# Patient Record
Sex: Male | Born: 1975
Health system: Southern US, Community
[De-identification: ages and names within clinical notes are randomized; demographics above are authoritative.]

---

## 2011-08-31 ENCOUNTER — Emergency Department: Payer: Self-pay | Admitting: Internal Medicine

## 2015-01-31 DIAGNOSIS — F4322 Adjustment disorder with anxiety: Secondary | ICD-10-CM | POA: Insufficient documentation

## 2015-08-11 DIAGNOSIS — K429 Umbilical hernia without obstruction or gangrene: Secondary | ICD-10-CM | POA: Insufficient documentation

## 2015-10-28 DIAGNOSIS — F419 Anxiety disorder, unspecified: Secondary | ICD-10-CM | POA: Diagnosis not present

## 2015-10-28 DIAGNOSIS — K420 Umbilical hernia with obstruction, without gangrene: Secondary | ICD-10-CM | POA: Diagnosis not present

## 2015-10-28 DIAGNOSIS — Z6837 Body mass index (BMI) 37.0-37.9, adult: Secondary | ICD-10-CM | POA: Diagnosis not present

## 2015-10-28 DIAGNOSIS — K429 Umbilical hernia without obstruction or gangrene: Secondary | ICD-10-CM | POA: Diagnosis not present

## 2015-10-28 DIAGNOSIS — F329 Major depressive disorder, single episode, unspecified: Secondary | ICD-10-CM | POA: Diagnosis not present

## 2016-04-26 ENCOUNTER — Ambulatory Visit (INDEPENDENT_AMBULATORY_CARE_PROVIDER_SITE_OTHER): Payer: 59 | Admitting: Podiatry

## 2016-04-26 VITALS — BP 133/86 | HR 83 | Resp 18

## 2016-04-26 DIAGNOSIS — R21 Rash and other nonspecific skin eruption: Secondary | ICD-10-CM

## 2016-04-26 DIAGNOSIS — L011 Impetiginization of other dermatoses: Secondary | ICD-10-CM | POA: Diagnosis not present

## 2016-04-26 DIAGNOSIS — B353 Tinea pedis: Secondary | ICD-10-CM

## 2016-04-26 DIAGNOSIS — L905 Scar conditions and fibrosis of skin: Secondary | ICD-10-CM | POA: Diagnosis not present

## 2016-04-26 DIAGNOSIS — L97529 Non-pressure chronic ulcer of other part of left foot with unspecified severity: Secondary | ICD-10-CM | POA: Diagnosis not present

## 2016-04-26 MED ORDER — TERBINAFINE HCL 250 MG PO TABS: 250.0000 mg | ORAL_TABLET | Freq: Every day | ORAL | Status: DC

## 2016-04-26 MED ORDER — DESOXIMETASONE 0.25 % EX CREA: 1.0000 "application " | TOPICAL_CREAM | Freq: Two times a day (BID) | CUTANEOUS | Status: DC

## 2016-04-26 NOTE — Progress Notes (Signed)
   Subjective:    Patient ID: Edwin Vazquez, male    DOB: 12-May-1976, 40 y.o.   MRN: [id number]  HPI  40 year old male presents the office they for concerns of possible athlete's foot as well as a skin rash to both feet which is been ongoing for greater than 1 year. He states the rashes to both feet do itch quite a bit. He states that he did go a dermatologist and thought it could be eczema but was apparently not given any treatment. He states that on the bottom of his feet they get raw and tender and whelps will form and he pops them.     Review of Systems  All other systems reviewed and are negative.      Objective:   Physical Exam General: AAO x3, NAD  Dermatological: On the plantar aspect of the right foot and to the interspaces is dry, xerotic, erythematous skin with some evidence of dried bulla on the right plantar foot. On the dorsal aspect left foot or multiple small annular erythematous lesions which directly to edge. There is no drainage or pus. On the right foot it appears being comes in the dorsal aspect of the foot and trying to go up to the ankle. No tenderness solution this time.  Vascular: Dorsalis Pedis artery and Posterior Tibial artery pedal pulses are 2/4 bilateral with immedate capillary fill time. Pedal hair growth present.  There is no pain with calf compression, swelling, warmth, erythema.   Neruologic: Grossly intact via light touch bilateral. Vibratory intact via tuning fork bilateral. Protective threshold with Semmes Wienstein monofilament intact to all pedal sites bilateral.   Musculoskeletal: No gross boney pedal deformities bilateral. No pain, crepitus, or limitation noted with foot and ankle range of motion bilateral. Muscular strength 5/5 in all groups tested bilateral.  Gait: Unassisted, Nonantalgic.      Assessment & Plan:  40 year old male bilateral skin rash, eczema versus dermatitis versus other dermatological issue -Treatment options discussed  including all alternatives, risks, and complications -Etiology of symptoms were discussed -At this time I recommended a punch biopsy the lesion. On the left foot underneath one of the lesions half cc of lidocaine with epinephrine was infiltrated. A 3 mm punch biopsy was then obtained and this was sent to [facility] labs. Area was irrigated and a buttock ointment and a dressing applied. Recommend daily dressing changes with similar. Monitor for infection -Will go ahead and start Lamisil for 2 weeks orally. -Prescribed Topicort we do not apply to the biopsy site -Follow up in 2 weeks or sooner if any issues are to arise. In the meantime I encouraged to call any questions or concerns or any change in symptoms.  Celesta Gentile, DPM

## 2016-04-28 DIAGNOSIS — R000 Tachycardia, unspecified: Secondary | ICD-10-CM | POA: Diagnosis not present

## 2016-04-28 DIAGNOSIS — R5383 Other fatigue: Secondary | ICD-10-CM | POA: Diagnosis not present

## 2016-04-28 DIAGNOSIS — E669 Obesity, unspecified: Secondary | ICD-10-CM | POA: Diagnosis not present

## 2016-04-28 DIAGNOSIS — D485 Neoplasm of uncertain behavior of skin: Secondary | ICD-10-CM | POA: Diagnosis not present

## 2016-04-29 NOTE — Addendum Note (Signed)
Addended by: Cranford Mon R on: 04/29/2016 07:34 AM   Modules accepted: Orders

## 2016-05-10 ENCOUNTER — Telehealth: Payer: Self-pay | Admitting: *Deleted

## 2016-05-10 NOTE — Telephone Encounter (Signed)
Dr. Jacqualyn Posey reviewed 04/26/2016 biopsy as non-specific, continue the topicort, and lamisil for now, may need to biopsy another area if not better.  I informed pt of Dr.Wagoner's orders and pt states the area improved almost over night, it must be the oral medication because he hasn't been consistent with the cream.  I told pt to continue until seen in office 05/12/2016.

## 2016-05-12 ENCOUNTER — Ambulatory Visit (INDEPENDENT_AMBULATORY_CARE_PROVIDER_SITE_OTHER): Payer: 59 | Admitting: Podiatry

## 2016-05-12 ENCOUNTER — Encounter: Payer: Self-pay | Admitting: Podiatry

## 2016-05-12 DIAGNOSIS — B353 Tinea pedis: Secondary | ICD-10-CM

## 2016-05-12 DIAGNOSIS — R21 Rash and other nonspecific skin eruption: Secondary | ICD-10-CM | POA: Diagnosis not present

## 2016-05-12 MED ORDER — TERBINAFINE HCL 250 MG PO TABS: 250.0000 mg | ORAL_TABLET | Freq: Every day | ORAL | 0 refills | Status: DC

## 2016-05-16 NOTE — Progress Notes (Signed)
Subjective: 40 year old male presents the office if up with vibration bilateral skin rash. He states that the rash is actually much improved compared to what it was last appointment. He has not been using the Topicort knowing been using oral Lamisil. Denies any systemic complaints such as fevers, chills, nausea, vomiting. No acute changes since last appointment, and no other complaints at this time.   Objective: AAO x3, NAD DP/PT pulses palpable bilaterally, CRT less than 3 seconds Skin rash on bilateral feet. Improving. Continue the air in the dorsal aspect of the left foot is almost completely resolved. There is mild areas of erythematous, purulence, arch of the foot however there is no drainage. No open sores identified.  No edema, erythema, increase in warmth to bilateral lower extremities.  No open lesions or pre-ulcerative lesions.  No pain with calf compression, swelling, warmth, erythema  Assessment: Skin rash, improved  Plan: -All treatment options discussed with the patient including all alternatives, risks, complications.  -I discussed the biopsy results of the patient. This point I recommend continue with Lamisil for 2 more weeks. I did order blood work to have baseline studies as will be continuing Lamisil. Continues monitoring side effects. She currently denies any. -Follow-up next couple weeks if symptoms do not completely resolve or sooner if any issues are to arise. -Patient encouraged to call the office with any questions, concerns, change in symptoms.   Celesta Gentile, DPM

## 2016-06-02 DIAGNOSIS — R000 Tachycardia, unspecified: Secondary | ICD-10-CM | POA: Diagnosis not present

## 2016-06-02 DIAGNOSIS — E669 Obesity, unspecified: Secondary | ICD-10-CM | POA: Diagnosis not present

## 2016-06-09 ENCOUNTER — Ambulatory Visit: Payer: 59 | Admitting: Podiatry

## 2016-06-16 DIAGNOSIS — L812 Freckles: Secondary | ICD-10-CM | POA: Diagnosis not present

## 2016-06-16 DIAGNOSIS — Q829 Congenital malformation of skin, unspecified: Secondary | ICD-10-CM | POA: Diagnosis not present

## 2016-06-16 DIAGNOSIS — D225 Melanocytic nevi of trunk: Secondary | ICD-10-CM | POA: Diagnosis not present

## 2016-06-16 DIAGNOSIS — L578 Other skin changes due to chronic exposure to nonionizing radiation: Secondary | ICD-10-CM | POA: Diagnosis not present

## 2016-06-16 DIAGNOSIS — L738 Other specified follicular disorders: Secondary | ICD-10-CM | POA: Diagnosis not present

## 2016-06-16 DIAGNOSIS — Z1283 Encounter for screening for malignant neoplasm of skin: Secondary | ICD-10-CM | POA: Diagnosis not present

## 2016-06-16 DIAGNOSIS — D485 Neoplasm of uncertain behavior of skin: Secondary | ICD-10-CM | POA: Diagnosis not present

## 2016-06-16 DIAGNOSIS — D229 Melanocytic nevi, unspecified: Secondary | ICD-10-CM | POA: Diagnosis not present

## 2016-06-16 DIAGNOSIS — L859 Epidermal thickening, unspecified: Secondary | ICD-10-CM | POA: Diagnosis not present

## 2016-07-05 DIAGNOSIS — R062 Wheezing: Secondary | ICD-10-CM | POA: Diagnosis not present

## 2016-07-05 DIAGNOSIS — J209 Acute bronchitis, unspecified: Secondary | ICD-10-CM | POA: Diagnosis not present

## 2016-07-05 DIAGNOSIS — E669 Obesity, unspecified: Secondary | ICD-10-CM | POA: Diagnosis not present

## 2016-07-05 DIAGNOSIS — R05 Cough: Secondary | ICD-10-CM | POA: Diagnosis not present

## 2016-08-02 ENCOUNTER — Other Ambulatory Visit: Payer: Self-pay | Admitting: Nurse Practitioner

## 2016-08-02 ENCOUNTER — Ambulatory Visit
Admission: RE | Admit: 2016-08-02 | Discharge: 2016-08-02 | Disposition: A | Discharge status: Home / Self Care | Payer: 59 | Source: Ambulatory Visit | Attending: Nurse Practitioner | Admitting: Nurse Practitioner

## 2016-08-02 DIAGNOSIS — R05 Cough: Secondary | ICD-10-CM | POA: Insufficient documentation

## 2016-08-02 DIAGNOSIS — R062 Wheezing: Secondary | ICD-10-CM | POA: Insufficient documentation

## 2016-08-02 DIAGNOSIS — R059 Cough, unspecified: Secondary | ICD-10-CM

## 2016-12-26 DIAGNOSIS — E669 Obesity, unspecified: Secondary | ICD-10-CM | POA: Diagnosis not present

## 2016-12-26 DIAGNOSIS — H6501 Acute serous otitis media, right ear: Secondary | ICD-10-CM | POA: Diagnosis not present

## 2017-03-15 IMAGING — CR DG CHEST 2V
1 series · 2 of 2 positions shown · non-contrast
Comparison: None.

CLINICAL DATA: Cough and wheezing

EXAM:
CHEST  2 VIEW

[Series 1: w chest pa · 0.14mm/px · 2 of 2 slices shown]
[im 1/2]
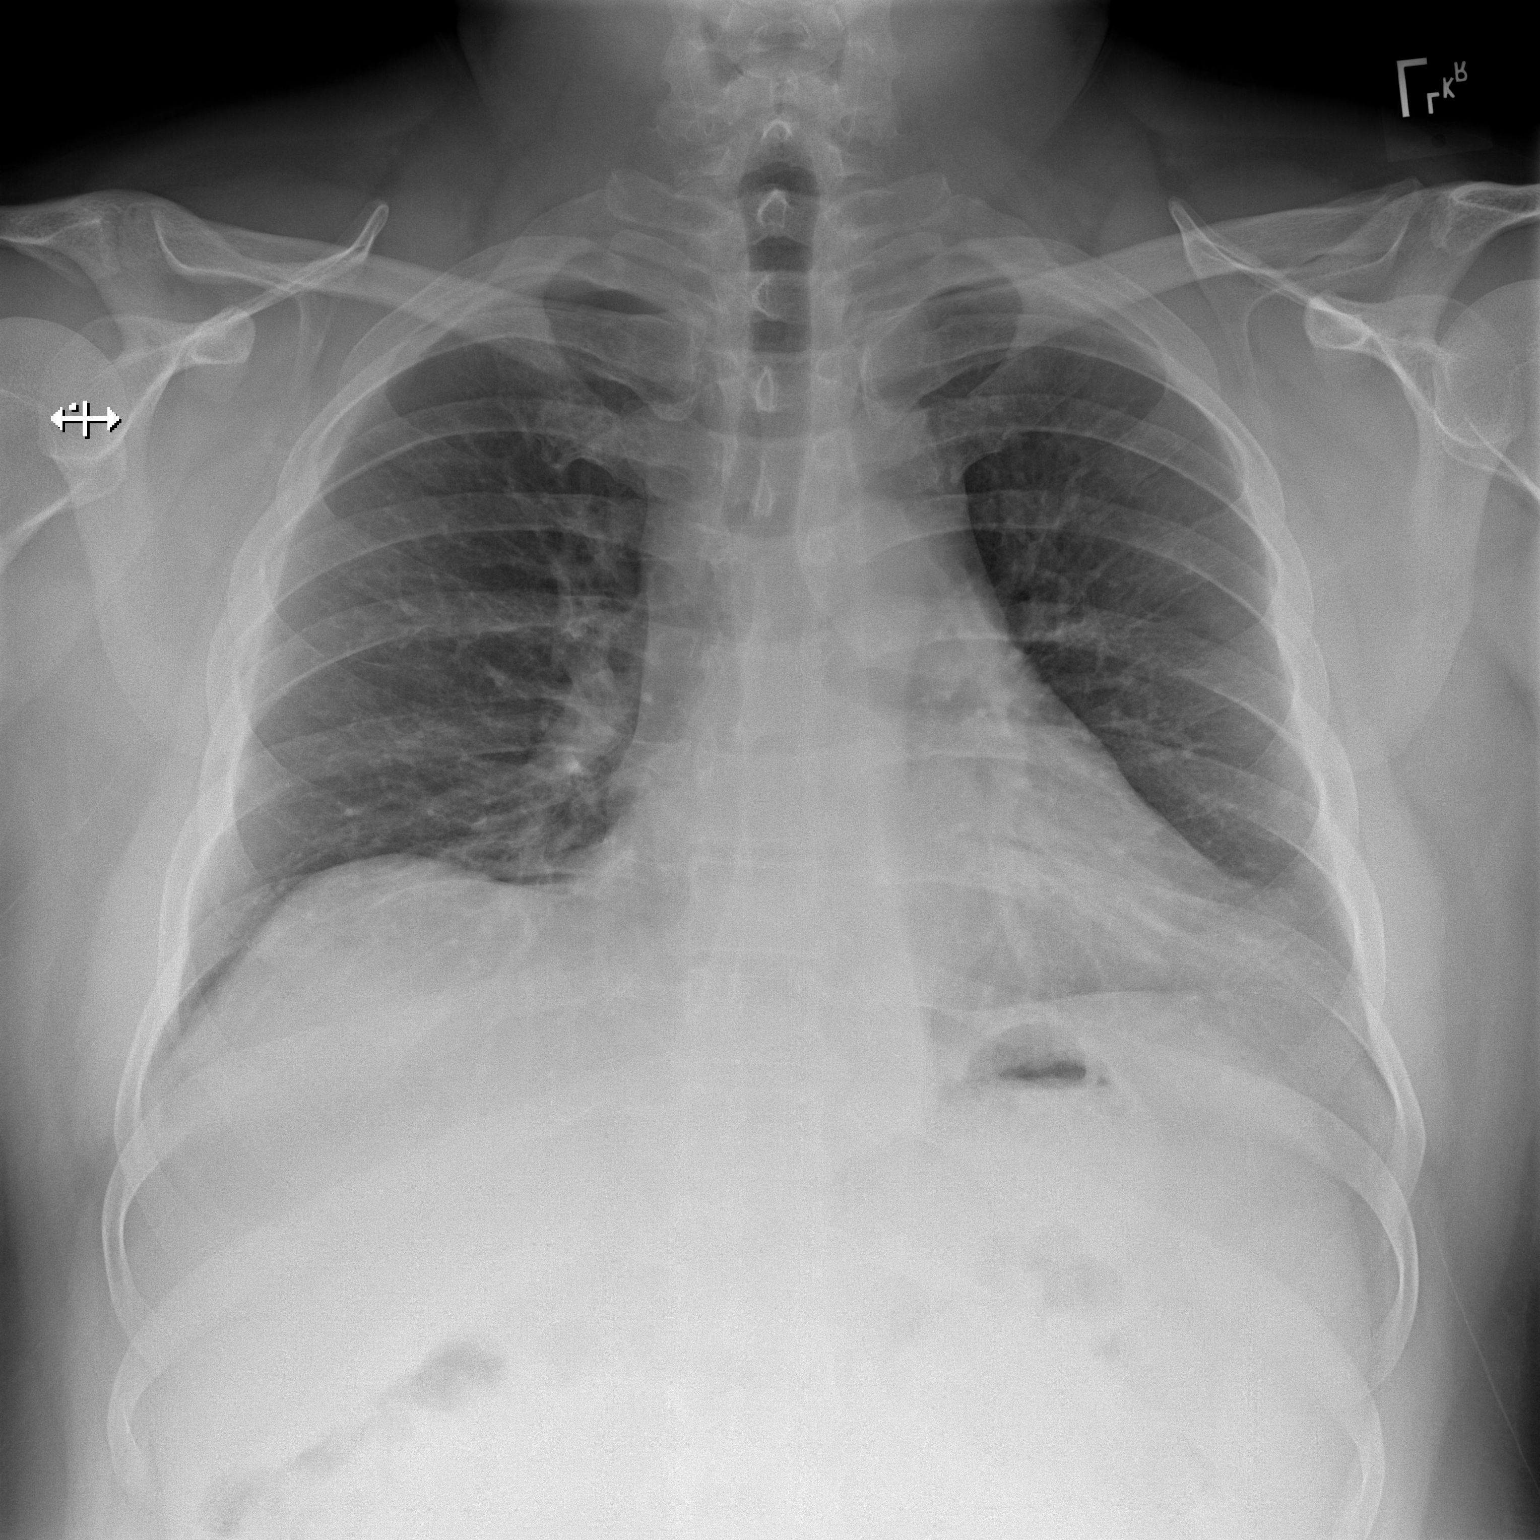
[im 2/2]
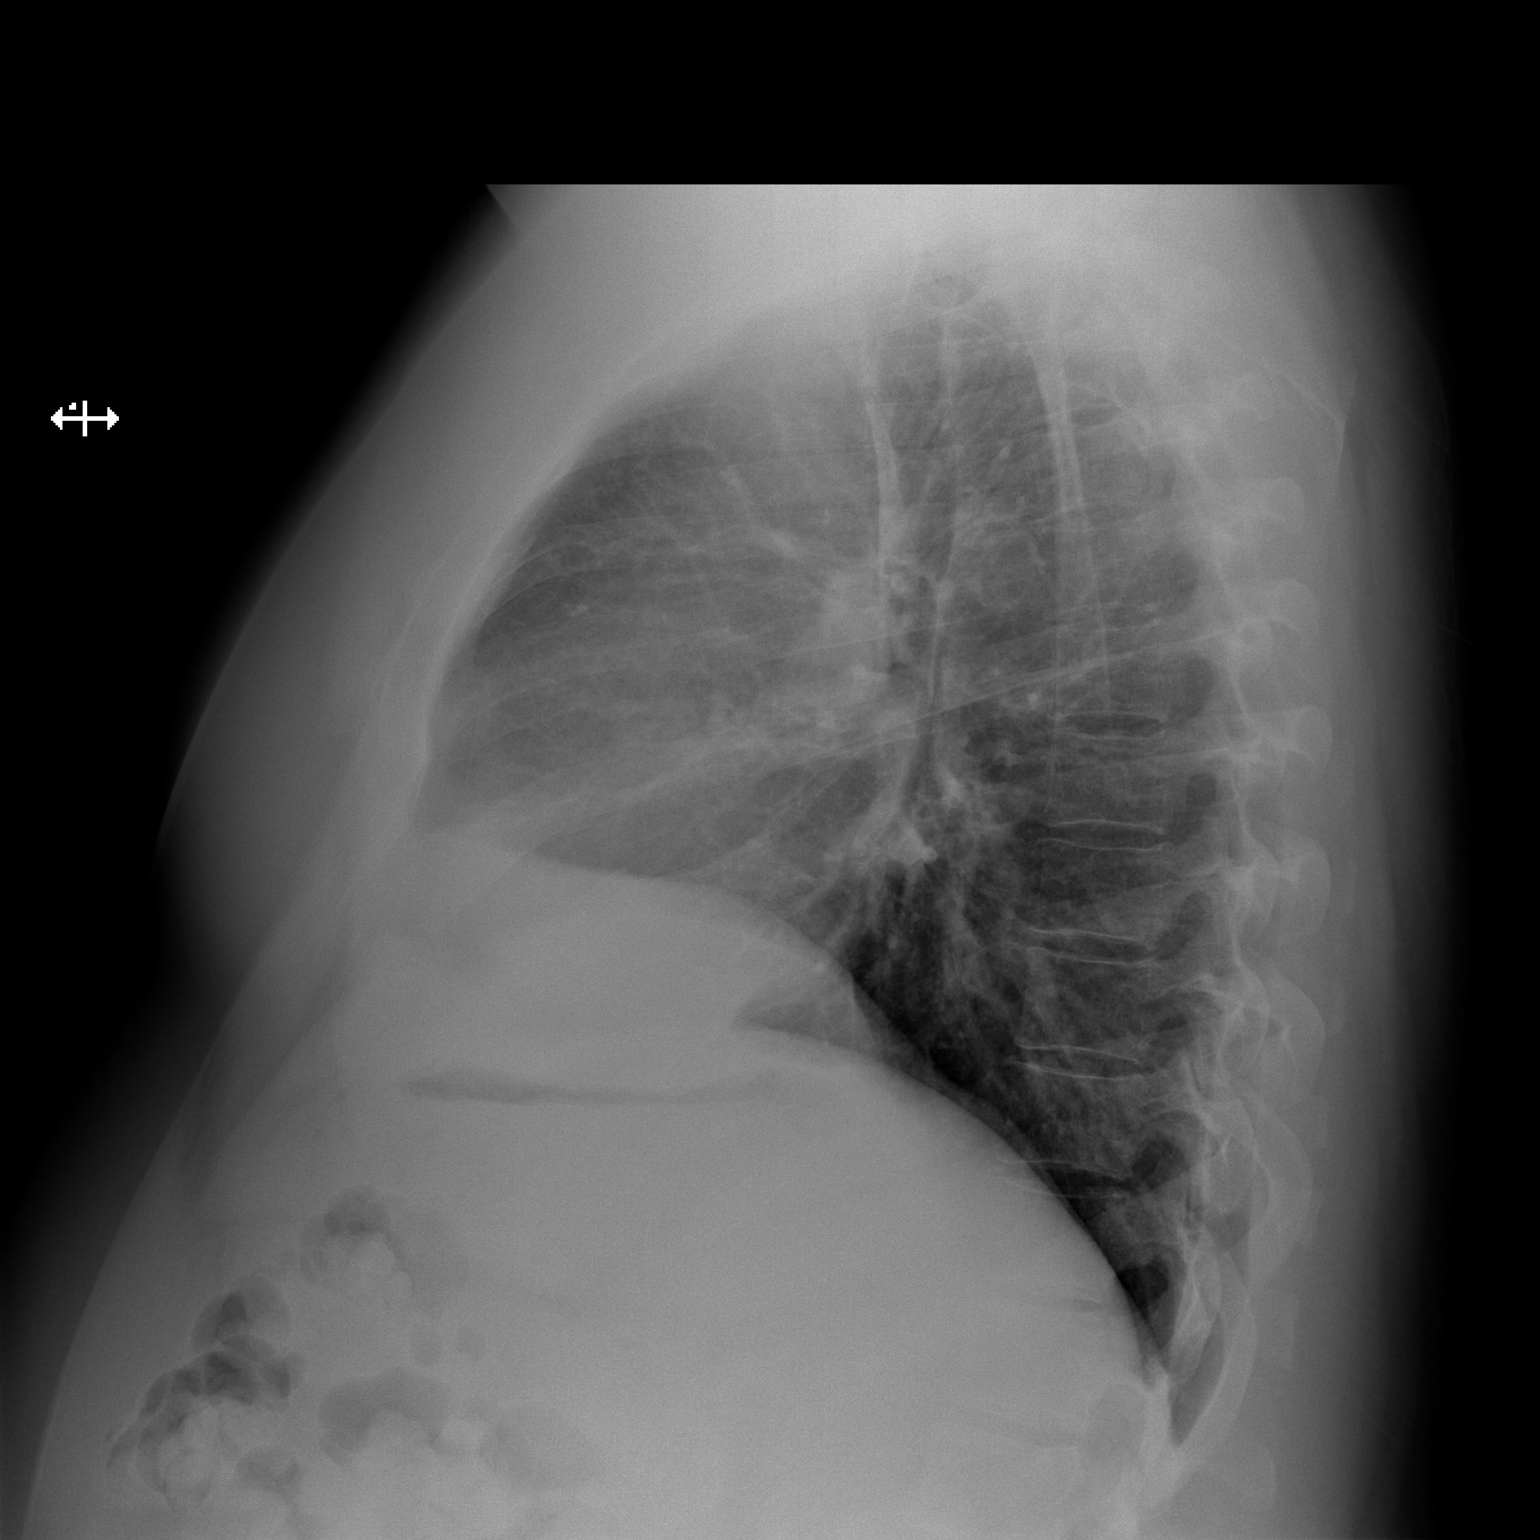

[2 of 2 positions shown; findings below may reference images not displayed]

FINDINGS: Normal heart size. Normal mediastinal contour. No pneumothorax. No
pleural effusion. Lungs appear clear, with no acute consolidative
airspace disease and no pulmonary edema.
IMPRESSION: No active cardiopulmonary disease.

## 2018-09-03 ENCOUNTER — Emergency Department: Payer: No Typology Code available for payment source

## 2018-09-03 ENCOUNTER — Emergency Department
Admission: EM | Admit: 2018-09-03 | Discharge: 2018-09-03 | Disposition: A | Discharge status: Home / Self Care | Payer: No Typology Code available for payment source | Attending: Emergency Medicine | Admitting: Emergency Medicine

## 2018-09-03 ENCOUNTER — Encounter: Payer: Self-pay | Admitting: *Deleted

## 2018-09-03 ENCOUNTER — Other Ambulatory Visit: Payer: Self-pay

## 2018-09-03 DIAGNOSIS — Y9389 Activity, other specified: Secondary | ICD-10-CM | POA: Diagnosis not present

## 2018-09-03 DIAGNOSIS — Y999 Unspecified external cause status: Secondary | ICD-10-CM | POA: Diagnosis not present

## 2018-09-03 DIAGNOSIS — S3991XA Unspecified injury of abdomen, initial encounter: Secondary | ICD-10-CM | POA: Insufficient documentation

## 2018-09-03 DIAGNOSIS — S199XXA Unspecified injury of neck, initial encounter: Secondary | ICD-10-CM | POA: Diagnosis present

## 2018-09-03 DIAGNOSIS — M542 Cervicalgia: Secondary | ICD-10-CM | POA: Insufficient documentation

## 2018-09-03 DIAGNOSIS — R109 Unspecified abdominal pain: Secondary | ICD-10-CM | POA: Insufficient documentation

## 2018-09-03 DIAGNOSIS — Y92410 Unspecified street and highway as the place of occurrence of the external cause: Secondary | ICD-10-CM | POA: Diagnosis not present

## 2018-09-03 LAB — COMPREHENSIVE METABOLIC PANEL
ALK PHOS: 68 U/L (ref 38–126)
ALT: 131 U/L — AB (ref 0–44)
ANION GAP: 6 (ref 5–15)
AST: 85 U/L — ABNORMAL HIGH (ref 15–41)
Albumin: 4.2 g/dL (ref 3.5–5.0)
BILIRUBIN TOTAL: 0.6 mg/dL (ref 0.3–1.2)
BUN: 13 mg/dL (ref 6–20)
CO2: 33 mmol/L — ABNORMAL HIGH (ref 22–32)
CREATININE: 1.02 mg/dL (ref 0.61–1.24)
Calcium: 8.6 mg/dL — ABNORMAL LOW (ref 8.9–10.3)
Chloride: 113 mmol/L — ABNORMAL HIGH (ref 98–111)
GFR calc Af Amer: >60 mL/min (ref >60)
GFR calc non Af Amer: >60 mL/min (ref >60)
GLUCOSE: 75 mg/dL (ref 70–99)
Potassium: 4.5 mmol/L (ref 3.5–5.1)
Sodium: 152 mmol/L — ABNORMAL HIGH (ref 135–145)
Total Protein: 7.4 g/dL (ref 6.5–8.1)

## 2018-09-03 LAB — CBC
HEMATOCRIT: 48.1 % (ref 39.0–52.0)
Hemoglobin: 16 g/dL (ref 13.0–17.0)
MCH: 28.9 pg (ref 26.0–34.0)
MCHC: 33.3 g/dL (ref 30.0–36.0)
MCV: 87 fL (ref 80.0–100.0)
NRBC: 0 % (ref 0.0–0.2)
Platelets: 218 10*3/uL (ref 150–400)
RBC: 5.53 MIL/uL (ref 4.22–5.81)
RDW: 12.6 % (ref 11.5–15.5)
WBC: 8.6 10*3/uL (ref 4.0–10.5)

## 2018-09-03 MED ORDER — SODIUM CHLORIDE 0.9 % IV BOLUS
1000.0000 mL | Freq: Once | INTRAVENOUS | Status: AC
  Administered 2018-09-03: 1000 mL via INTRAVENOUS

## 2018-09-03 MED ORDER — DIPHENHYDRAMINE HCL 50 MG/ML IJ SOLN
INTRAMUSCULAR | Status: AC
  Administered 2018-09-03: 50 mg via INTRAVENOUS
  Filled 2018-09-03: qty 1

## 2018-09-03 MED ORDER — IBUPROFEN 400 MG PO TABS
600.0000 mg | ORAL_TABLET | Freq: Once | ORAL | Status: AC
  Administered 2018-09-03: 600 mg via ORAL
  Filled 2018-09-03: qty 2

## 2018-09-03 MED ORDER — IOHEXOL 300 MG/ML  SOLN
125.0000 mL | Freq: Once | INTRAMUSCULAR | Status: AC | PRN
  Administered 2018-09-03: 125 mL via INTRAVENOUS
  Filled 2018-09-03: qty 125

## 2018-09-03 MED ORDER — DIPHENHYDRAMINE HCL 50 MG/ML IJ SOLN
50.0000 mg | Freq: Once | INTRAMUSCULAR | Status: AC
  Administered 2018-09-03: 50 mg via INTRAVENOUS

## 2018-09-03 MED ORDER — EPINEPHRINE 0.3 MG/0.3ML IJ SOAJ
0.3000 mg | Freq: Once | INTRAMUSCULAR | Status: DC
  Filled 2018-09-03: qty 0.3

## 2018-09-03 MED ORDER — PREDNISONE 20 MG PO TABS
60.0000 mg | ORAL_TABLET | Freq: Once | ORAL | Status: AC
  Administered 2018-09-03: 60 mg via ORAL
  Filled 2018-09-03: qty 3

## 2018-09-03 MED ORDER — PREDNISONE 10 MG (21) PO TBPK: ORAL_TABLET | ORAL | 0 refills | Status: DC

## 2018-09-03 NOTE — Discharge Instructions (Signed)
Please seek medical attention for any high fevers, chest pain, shortness of breath, change in behavior, persistent vomiting, bloody stool or any other new or concerning symptoms.  
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
## 2018-10-01 ENCOUNTER — Other Ambulatory Visit: Payer: Self-pay | Admitting: Orthopedic Surgery

## 2018-10-01 DIAGNOSIS — M5412 Radiculopathy, cervical region: Secondary | ICD-10-CM

## 2018-10-14 ENCOUNTER — Ambulatory Visit
Admission: RE | Admit: 2018-10-14 | Discharge: 2018-10-14 | Disposition: A | Discharge status: Home / Self Care | Payer: No Typology Code available for payment source | Source: Ambulatory Visit | Attending: Orthopedic Surgery | Admitting: Orthopedic Surgery

## 2018-10-14 DIAGNOSIS — M5412 Radiculopathy, cervical region: Secondary | ICD-10-CM | POA: Insufficient documentation

## 2018-11-13 ENCOUNTER — Ambulatory Visit: Payer: PRIVATE HEALTH INSURANCE | Attending: Neurosurgery | Admitting: Physical Therapy

## 2018-11-13 ENCOUNTER — Encounter: Payer: Self-pay | Admitting: Physical Therapy

## 2018-11-13 DIAGNOSIS — M542 Cervicalgia: Secondary | ICD-10-CM | POA: Insufficient documentation
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
## 2018-11-15 ENCOUNTER — Encounter: Payer: Self-pay | Admitting: Physical Therapy

## 2018-11-15 ENCOUNTER — Ambulatory Visit: Payer: PRIVATE HEALTH INSURANCE | Admitting: Physical Therapy

## 2018-11-15 DIAGNOSIS — M542 Cervicalgia: Secondary | ICD-10-CM
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
## 2018-11-20 ENCOUNTER — Encounter: Payer: Self-pay | Admitting: Physical Therapy

## 2018-11-20 ENCOUNTER — Ambulatory Visit: Payer: PRIVATE HEALTH INSURANCE | Admitting: Physical Therapy

## 2018-11-20 DIAGNOSIS — M542 Cervicalgia: Secondary | ICD-10-CM
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
## 2018-11-22 ENCOUNTER — Ambulatory Visit: Payer: PRIVATE HEALTH INSURANCE | Admitting: Physical Therapy

## 2018-11-26 ENCOUNTER — Ambulatory Visit: Payer: PRIVATE HEALTH INSURANCE | Admitting: Physical Therapy

## 2018-11-28 ENCOUNTER — Ambulatory Visit: Payer: PRIVATE HEALTH INSURANCE | Admitting: Physical Therapy

## 2018-12-03 ENCOUNTER — Ambulatory Visit: Payer: PRIVATE HEALTH INSURANCE | Admitting: Physical Therapy

## 2018-12-06 ENCOUNTER — Ambulatory Visit: Payer: PRIVATE HEALTH INSURANCE | Admitting: Physical Therapy

## 2018-12-11 ENCOUNTER — Encounter: Payer: PRIVATE HEALTH INSURANCE | Admitting: Physical Therapy

## 2018-12-14 ENCOUNTER — Encounter: Payer: PRIVATE HEALTH INSURANCE | Admitting: Physical Therapy

## 2018-12-17 ENCOUNTER — Encounter: Payer: PRIVATE HEALTH INSURANCE | Admitting: Physical Therapy

## 2018-12-18 ENCOUNTER — Encounter: Payer: PRIVATE HEALTH INSURANCE | Admitting: Physical Therapy

## 2018-12-19 ENCOUNTER — Encounter: Payer: PRIVATE HEALTH INSURANCE | Admitting: Physical Therapy

## 2018-12-20 ENCOUNTER — Encounter: Payer: PRIVATE HEALTH INSURANCE | Admitting: Physical Therapy

## 2018-12-24 ENCOUNTER — Encounter: Payer: PRIVATE HEALTH INSURANCE | Admitting: Physical Therapy

## 2018-12-25 ENCOUNTER — Encounter: Payer: PRIVATE HEALTH INSURANCE | Admitting: Physical Therapy

## 2018-12-27 ENCOUNTER — Encounter: Payer: PRIVATE HEALTH INSURANCE | Admitting: Physical Therapy

## 2018-12-31 ENCOUNTER — Encounter: Payer: PRIVATE HEALTH INSURANCE | Admitting: Physical Therapy

## 2019-01-01 ENCOUNTER — Encounter: Payer: PRIVATE HEALTH INSURANCE | Admitting: Physical Therapy

## 2019-01-03 ENCOUNTER — Encounter: Payer: PRIVATE HEALTH INSURANCE | Admitting: Physical Therapy

## 2019-01-07 ENCOUNTER — Encounter: Payer: PRIVATE HEALTH INSURANCE | Admitting: Physical Therapy

## 2019-01-08 ENCOUNTER — Encounter: Payer: PRIVATE HEALTH INSURANCE | Admitting: Physical Therapy

## 2019-02-20 ENCOUNTER — Other Ambulatory Visit: Payer: Self-pay | Admitting: Nurse Practitioner

## 2019-02-20 DIAGNOSIS — E668 Other obesity: Secondary | ICD-10-CM

## 2019-02-20 MED ORDER — PHENTERMINE HCL 37.5 MG PO CAPS: 37.5000 mg | ORAL_CAPSULE | ORAL | 2 refills | Status: DC

## 2019-02-20 NOTE — Progress Notes (Signed)
Renewed patients prescription for phentermine capsules  And sent to [facility].

## 2019-03-27 ENCOUNTER — Ambulatory Visit (INDEPENDENT_AMBULATORY_CARE_PROVIDER_SITE_OTHER): Payer: BC Managed Care – PPO | Admitting: Nurse Practitioner

## 2019-03-27 ENCOUNTER — Encounter: Payer: Self-pay | Admitting: Nurse Practitioner

## 2019-03-27 ENCOUNTER — Other Ambulatory Visit: Payer: Self-pay

## 2019-03-27 VITALS — BP 126/83 | HR 77 | Resp 16 | Ht 72.0 in | Wt 279.6 lb

## 2019-03-27 DIAGNOSIS — M5000 Cervical disc disorder with myelopathy, unspecified cervical region: Secondary | ICD-10-CM | POA: Diagnosis not present

## 2019-03-27 DIAGNOSIS — F4310 Post-traumatic stress disorder, unspecified: Secondary | ICD-10-CM

## 2019-03-27 DIAGNOSIS — Z89232 Acquired absence of left shoulder: Secondary | ICD-10-CM

## 2019-03-27 MED ORDER — DULOXETINE HCL 20 MG PO CPEP: 20.0000 mg | ORAL_CAPSULE | Freq: Every day | ORAL | 3 refills | Status: DC

## 2019-03-27 MED ORDER — PREDNISONE 10 MG (48) PO TBPK: ORAL_TABLET | ORAL | 0 refills | Status: DC
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
## 2019-03-30 DIAGNOSIS — F4310 Post-traumatic stress disorder, unspecified: Secondary | ICD-10-CM | POA: Insufficient documentation

## 2019-03-30 DIAGNOSIS — Z89232 Acquired absence of left shoulder: Secondary | ICD-10-CM | POA: Insufficient documentation

## 2019-03-30 DIAGNOSIS — M5000 Cervical disc disorder with myelopathy, unspecified cervical region: Secondary | ICD-10-CM | POA: Insufficient documentation

## 2019-04-17 DIAGNOSIS — M542 Cervicalgia: Secondary | ICD-10-CM | POA: Diagnosis not present

## 2019-05-13 ENCOUNTER — Other Ambulatory Visit: Payer: Self-pay

## 2019-05-13 DIAGNOSIS — Z20828 Contact with and (suspected) exposure to other viral communicable diseases: Secondary | ICD-10-CM

## 2019-05-13 DIAGNOSIS — Z20822 Contact with and (suspected) exposure to covid-19: Secondary | ICD-10-CM

## 2019-05-14 ENCOUNTER — Other Ambulatory Visit: Payer: Self-pay

## 2019-05-14 DIAGNOSIS — R6889 Other general symptoms and signs: Secondary | ICD-10-CM | POA: Diagnosis not present

## 2019-05-14 DIAGNOSIS — Z20822 Contact with and (suspected) exposure to covid-19: Secondary | ICD-10-CM

## 2019-05-15 ENCOUNTER — Other Ambulatory Visit: Payer: Self-pay

## 2019-05-15 ENCOUNTER — Ambulatory Visit (INDEPENDENT_AMBULATORY_CARE_PROVIDER_SITE_OTHER): Payer: BC Managed Care – PPO | Admitting: Licensed Clinical Social Worker

## 2019-05-15 ENCOUNTER — Encounter: Payer: Self-pay | Admitting: Licensed Clinical Social Worker

## 2019-05-15 DIAGNOSIS — F4310 Post-traumatic stress disorder, unspecified: Secondary | ICD-10-CM

## 2019-05-15 LAB — NOVEL CORONAVIRUS, NAA: SARS-CoV-2, NAA: NOT DETECTED
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
## 2019-05-29 DIAGNOSIS — M5412 Radiculopathy, cervical region: Secondary | ICD-10-CM | POA: Diagnosis not present

## 2019-05-29 DIAGNOSIS — M542 Cervicalgia: Secondary | ICD-10-CM | POA: Diagnosis not present

## 2019-06-05 ENCOUNTER — Encounter: Payer: Self-pay | Admitting: Licensed Clinical Social Worker

## 2019-06-05 ENCOUNTER — Ambulatory Visit (INDEPENDENT_AMBULATORY_CARE_PROVIDER_SITE_OTHER): Payer: BC Managed Care – PPO | Admitting: Licensed Clinical Social Worker

## 2019-06-05 ENCOUNTER — Other Ambulatory Visit: Payer: Self-pay

## 2019-06-05 DIAGNOSIS — F4310 Post-traumatic stress disorder, unspecified: Secondary | ICD-10-CM | POA: Diagnosis not present

## 2019-06-05 NOTE — Progress Notes (Signed)
Virtual Visit via Video Note  I connected with Edwin Vazquez on 06/05/19 at 10:00 AM EDT by a video enabled telemedicine application and verified that I am speaking with the correct person using two identifiers.   I discussed the limitations of evaluation and management by telemedicine and the availability of in person appointments. The patient expressed understanding and agreed to proceed.  I discussed the assessment and treatment plan with the patient. The patient was provided an opportunity to ask questions and all were answered. The patient agreed with the plan and demonstrated an understanding of the instructions.   The patient was advised to call back or seek an in-person evaluation if the symptoms worsen or if the condition fails to improve as anticipated.  I provided 55 minutes of non-face-to-face time during this encounter.   Alden Hipp, LCSW    THERAPIST PROGRESS NOTE  Session Time: 1000  Participation Level: Active  Behavioral Response: CasualAlertAnxious  Type of Therapy: Individual Therapy  Treatment Goals addressed: Anxiety  Interventions: CBT  Summary: Edwin Vazquez is a 43 y.o. male who presents with continued symptoms related to his diagnosis. Tan, who goes by Leroy Sea, reported doing well since our last session. He reports continued anxiety while driving, escalating to panic at times, which has impacted his life since our last session. LCSW asked Edwin Vazquez to recall his thoughts while driving, and asked him to articulate how that impacts his driving. He reports feeling others are not driving well, and are putting him at risk. He reports feeling angry, and often avoiding driving on the highways all together. LCSW reviewed the components of CBT and how they could be utilized to assist Edwin Vazquez with his anxiety and panic while driving. Edwin Vazquez expressed understanding and agreement with these ideas, and expressed feeling unsure if they would help. LCSW asked Edwin Vazquez  to at least give it a shot, while also attempting to ground himself with mindfulness techniques. LCSW provided Edwin Vazquez with several techniques he could utilize to do so, including muscle relaxation and breathing exercises. Edwin Vazquez expressed understanding and agreement. Edwin Vazquez also reported feeling pressure from his job to work on his day off. We reviewed how to utilize assertive communication to set an appropriate boundary with his employer. Edwin Vazquez expressed understanding with this idea as well.   Suicidal/Homicidal: No  Therapist Response: Edwin Vazquez continues to work towards his tx goals but has not yet reached them. We will continue to work on improving emotional regulation skills moving forward via CBT.  Plan: Return again in 4 weeks.  Diagnosis: Axis I: Post Traumatic Stress Disorder    Axis II: No diagnosis    Alden Hipp, LCSW 06/05/2019

## 2019-07-10 ENCOUNTER — Ambulatory Visit: Payer: BC Managed Care – PPO | Admitting: Licensed Clinical Social Worker

## 2019-07-10 ENCOUNTER — Other Ambulatory Visit: Payer: Self-pay

## 2019-07-16 ENCOUNTER — Other Ambulatory Visit: Payer: Self-pay

## 2019-07-16 DIAGNOSIS — Z20822 Contact with and (suspected) exposure to covid-19: Secondary | ICD-10-CM

## 2019-07-16 DIAGNOSIS — Z20828 Contact with and (suspected) exposure to other viral communicable diseases: Secondary | ICD-10-CM

## 2019-07-18 LAB — NOVEL CORONAVIRUS, NAA: SARS-CoV-2, NAA: NOT DETECTED

## 2019-07-23 DIAGNOSIS — M542 Cervicalgia: Secondary | ICD-10-CM | POA: Diagnosis not present

## 2019-08-20 ENCOUNTER — Ambulatory Visit (INDEPENDENT_AMBULATORY_CARE_PROVIDER_SITE_OTHER): Payer: BC Managed Care – PPO

## 2019-08-20 DIAGNOSIS — Z23 Encounter for immunization: Secondary | ICD-10-CM | POA: Diagnosis not present

## 2019-08-27 DIAGNOSIS — M62838 Other muscle spasm: Secondary | ICD-10-CM | POA: Diagnosis not present

## 2019-10-16 DIAGNOSIS — M542 Cervicalgia: Secondary | ICD-10-CM | POA: Diagnosis not present

## 2019-10-16 DIAGNOSIS — M5412 Radiculopathy, cervical region: Secondary | ICD-10-CM | POA: Diagnosis not present

## 2019-11-04 ENCOUNTER — Ambulatory Visit: Payer: BC Managed Care – PPO | Attending: Internal Medicine

## 2019-11-04 DIAGNOSIS — Z20822 Contact with and (suspected) exposure to covid-19: Secondary | ICD-10-CM

## 2019-11-05 ENCOUNTER — Other Ambulatory Visit: Payer: Self-pay

## 2019-11-05 LAB — NOVEL CORONAVIRUS, NAA: SARS-CoV-2, NAA: NOT DETECTED

## 2019-11-05 MED ORDER — AZITHROMYCIN 250 MG PO TABS: ORAL_TABLET | ORAL | 0 refills | Status: DC

## 2019-11-06 ENCOUNTER — Telehealth: Payer: Self-pay

## 2019-11-06 NOTE — Telephone Encounter (Signed)
Send zpak yesterday as per heather for sinus infection

## 2019-12-27 ENCOUNTER — Other Ambulatory Visit: Payer: Self-pay

## 2019-12-27 MED ORDER — DOXYCYCLINE HYCLATE 100 MG PO TABS: 100.0000 mg | ORAL_TABLET | Freq: Two times a day (BID) | ORAL | 0 refills | Status: DC

## 2020-02-07 ENCOUNTER — Other Ambulatory Visit: Payer: Self-pay

## 2020-02-07 MED ORDER — TAMSULOSIN HCL 0.4 MG PO CAPS: 0.4000 mg | ORAL_CAPSULE | Freq: Every day | ORAL | 0 refills | Status: DC

## 2020-02-07 MED ORDER — KETOROLAC TROMETHAMINE 10 MG PO TABS: 10.0000 mg | ORAL_TABLET | Freq: Four times a day (QID) | ORAL | 0 refills | Status: DC | PRN

## 2020-05-07 ENCOUNTER — Other Ambulatory Visit: Payer: Self-pay | Admitting: Nurse Practitioner

## 2020-05-07 DIAGNOSIS — M5000 Cervical disc disorder with myelopathy, unspecified cervical region: Secondary | ICD-10-CM

## 2020-05-07 MED ORDER — CYCLOBENZAPRINE HCL 10 MG PO TABS: ORAL_TABLET | ORAL | 2 refills | Status: DC

## 2020-05-11 DIAGNOSIS — M7542 Impingement syndrome of left shoulder: Secondary | ICD-10-CM | POA: Diagnosis not present

## 2020-05-18 DIAGNOSIS — M7542 Impingement syndrome of left shoulder: Secondary | ICD-10-CM | POA: Diagnosis not present

## 2020-05-25 DIAGNOSIS — M7542 Impingement syndrome of left shoulder: Secondary | ICD-10-CM | POA: Diagnosis not present

## 2020-06-17 DIAGNOSIS — M25512 Pain in left shoulder: Secondary | ICD-10-CM | POA: Diagnosis not present

## 2020-06-17 DIAGNOSIS — M7542 Impingement syndrome of left shoulder: Secondary | ICD-10-CM | POA: Diagnosis not present

## 2020-06-18 ENCOUNTER — Other Ambulatory Visit: Payer: Self-pay | Admitting: Nurse Practitioner

## 2020-06-18 DIAGNOSIS — J069 Acute upper respiratory infection, unspecified: Secondary | ICD-10-CM

## 2020-06-18 MED ORDER — IVERMECTIN 3 MG PO TABS: ORAL_TABLET | ORAL | 0 refills | Status: DC

## 2020-06-18 MED ORDER — AZITHROMYCIN 250 MG PO TABS: ORAL_TABLET | ORAL | 0 refills | Status: DC

## 2020-06-24 DIAGNOSIS — Z03818 Encounter for observation for suspected exposure to other biological agents ruled out: Secondary | ICD-10-CM | POA: Diagnosis not present

## 2020-06-24 DIAGNOSIS — Z1152 Encounter for screening for COVID-19: Secondary | ICD-10-CM | POA: Diagnosis not present

## 2020-07-22 DIAGNOSIS — M25512 Pain in left shoulder: Secondary | ICD-10-CM | POA: Diagnosis not present

## 2020-07-22 DIAGNOSIS — M7542 Impingement syndrome of left shoulder: Secondary | ICD-10-CM | POA: Diagnosis not present

## 2020-07-29 DIAGNOSIS — M25512 Pain in left shoulder: Secondary | ICD-10-CM | POA: Diagnosis not present

## 2020-07-29 DIAGNOSIS — M7542 Impingement syndrome of left shoulder: Secondary | ICD-10-CM | POA: Diagnosis not present

## 2020-08-25 ENCOUNTER — Other Ambulatory Visit: Payer: Self-pay | Admitting: Nurse Practitioner

## 2020-08-25 DIAGNOSIS — J329 Chronic sinusitis, unspecified: Secondary | ICD-10-CM

## 2020-08-25 MED ORDER — AZITHROMYCIN 250 MG PO TABS: ORAL_TABLET | ORAL | 0 refills | Status: DC

## 2020-09-16 ENCOUNTER — Ambulatory Visit
Admission: RE | Admit: 2020-09-16 | Discharge: 2020-09-16 | Disposition: A | Discharge status: Home / Self Care | Payer: BC Managed Care – PPO | Source: Ambulatory Visit | Attending: Nurse Practitioner | Admitting: Nurse Practitioner

## 2020-09-16 ENCOUNTER — Other Ambulatory Visit: Payer: Self-pay

## 2020-09-16 ENCOUNTER — Other Ambulatory Visit: Payer: Self-pay | Admitting: Nurse Practitioner

## 2020-09-16 ENCOUNTER — Ambulatory Visit
Admission: RE | Admit: 2020-09-16 | Discharge: 2020-09-16 | Disposition: A | Discharge status: Home / Self Care | Payer: BC Managed Care – PPO | Attending: Nurse Practitioner | Admitting: Nurse Practitioner

## 2020-09-16 DIAGNOSIS — R059 Cough, unspecified: Secondary | ICD-10-CM | POA: Diagnosis not present

## 2020-09-16 DIAGNOSIS — U071 COVID-19: Secondary | ICD-10-CM

## 2020-09-16 DIAGNOSIS — R0602 Shortness of breath: Secondary | ICD-10-CM | POA: Diagnosis not present

## 2020-09-16 DIAGNOSIS — J069 Acute upper respiratory infection, unspecified: Secondary | ICD-10-CM

## 2020-09-16 DIAGNOSIS — M5000 Cervical disc disorder with myelopathy, unspecified cervical region: Secondary | ICD-10-CM

## 2020-09-16 MED ORDER — PREDNISONE 10 MG (48) PO TBPK: ORAL_TABLET | ORAL | 0 refills | Status: DC

## 2020-09-17 NOTE — Progress Notes (Signed)
Chest x-ray normal

## 2020-12-30 ENCOUNTER — Other Ambulatory Visit (HOSPITAL_COMMUNITY): Payer: Self-pay

## 2021-02-10 ENCOUNTER — Other Ambulatory Visit: Payer: Self-pay

## 2021-02-10 DIAGNOSIS — J309 Allergic rhinitis, unspecified: Secondary | ICD-10-CM | POA: Diagnosis not present

## 2021-02-10 DIAGNOSIS — J329 Chronic sinusitis, unspecified: Secondary | ICD-10-CM | POA: Diagnosis not present

## 2021-02-10 MED ORDER — PREDNISONE 10 MG PO TABS
ORAL_TABLET | ORAL | 0 refills | Status: DC
  Filled 2021-02-10: qty 48, 12d supply, fill #0

## 2021-02-10 MED ORDER — CLINDAMYCIN HCL 300 MG PO CAPS
ORAL_CAPSULE | ORAL | 0 refills | Status: DC
  Filled 2021-02-10: qty 63, 21d supply, fill #0

## 2021-02-10 MED ORDER — FLUTICASONE PROPIONATE 50 MCG/ACT NA SUSP
NASAL | 10 refills | Status: AC
  Filled 2021-02-10: qty 16, 30d supply, fill #0

## 2021-03-03 DIAGNOSIS — J329 Chronic sinusitis, unspecified: Secondary | ICD-10-CM | POA: Diagnosis not present

## 2021-03-10 DIAGNOSIS — J301 Allergic rhinitis due to pollen: Secondary | ICD-10-CM | POA: Diagnosis not present

## 2021-03-17 ENCOUNTER — Other Ambulatory Visit: Payer: Self-pay

## 2021-03-17 DIAGNOSIS — J309 Allergic rhinitis, unspecified: Secondary | ICD-10-CM | POA: Diagnosis not present

## 2021-03-17 DIAGNOSIS — J342 Deviated nasal septum: Secondary | ICD-10-CM | POA: Diagnosis not present

## 2021-03-17 DIAGNOSIS — J329 Chronic sinusitis, unspecified: Secondary | ICD-10-CM | POA: Diagnosis not present

## 2021-03-17 MED ORDER — FLUTICASONE PROPIONATE 50 MCG/ACT NA SUSP
NASAL | 10 refills | Status: AC
  Filled 2021-03-17: qty 16, 30d supply, fill #0

## 2021-03-17 MED ORDER — CETIRIZINE HCL 10 MG PO TABS: 10.0000 mg | ORAL_TABLET | Freq: Every day | ORAL | 10 refills | Status: DC

## 2021-03-19 ENCOUNTER — Other Ambulatory Visit: Payer: Self-pay

## 2021-03-19 MED ORDER — EPINEPHRINE 0.3 MG/0.3ML IJ SOAJ
INTRAMUSCULAR | 1 refills | Status: DC
  Filled 2021-03-19: qty 2, 30d supply, fill #0

## 2021-03-25 ENCOUNTER — Encounter: Payer: Self-pay | Admitting: Nurse Practitioner

## 2021-03-25 ENCOUNTER — Ambulatory Visit (INDEPENDENT_AMBULATORY_CARE_PROVIDER_SITE_OTHER): Payer: 59 | Admitting: Nurse Practitioner

## 2021-03-25 ENCOUNTER — Other Ambulatory Visit: Payer: Self-pay

## 2021-03-25 VITALS — BP 128/84 | HR 90 | Temp 97.6°F | Ht 72.0 in | Wt 303.4 lb

## 2021-03-25 DIAGNOSIS — M25512 Pain in left shoulder: Secondary | ICD-10-CM

## 2021-03-25 DIAGNOSIS — Z7689 Persons encountering health services in other specified circumstances: Secondary | ICD-10-CM

## 2021-03-25 DIAGNOSIS — G8929 Other chronic pain: Secondary | ICD-10-CM

## 2021-03-25 NOTE — Progress Notes (Signed)
Edwin Vazquez Office Visit  Subjective:  Vazquez ID: Edwin Vazquez, male    DOB: April 25, 1976  Age: 45 y.o. MRN: [id number]  CC:  Chief Complaint  Vazquez presents with   New Vazquez (Initial Visit)    HPI Edwin Vazquez presents to establish new primary care provider. Today, he has concern of left shoulder pain. States that the shoulder started to bother him about 2 years ago. He initially went to [facility] for evaluation and treatment. States that he had two cortisone injections into the shoulder to relieve pain and inflammation and was then sent to physical therapy. There physical therapy was causing him to have increased pain levels. He stopped going. He states that he did have x-rays of the left shoulder, but an MRI or further imaging was never pursued. He states that lifting with the left arm or swimming makes the pain worse. The pain is sharp and feels like a tearing sensation. He states that he does continue to do ROM exercises. He would like a referral to see a new orthopedic provider. Specifically, he would like to see Dr. Marry Guan. The Vazquez has no further concerns or complaints. He denies chest pain, chest pressure, or shortness of breath. He denies headaches or visual disturbances. He denies abdominal pain, nausea, vomiting, or changes in bowel or bladder habits.   He is due to have a routine physical exam along with routine, fasting labs.   History reviewed. No pertinent past medical history.  History reviewed. No pertinent surgical history.  History reviewed. No pertinent family history.  Social History   Socioeconomic History   Marital status: Married    Spouse name: Not on file   Number of children: Not on file   Years of education: Not on file   Highest education level: Not on file  Occupational History   Not on file  Tobacco Use   Smoking status: Never   Smokeless tobacco: Never  Substance and Sexual Activity   Alcohol use: Never   Drug use:  Never   Sexual activity: Yes  Other Topics Concern   Not on file  Social History Narrative   Not on file   Social Determinants of Health   Financial Resource Strain: Not on file  Food Insecurity: Not on file  Transportation Needs: Not on file  Physical Activity: Not on file  Stress: Not on file  Social Connections: Not on file  Intimate Partner Violence: Not on file    ROS Review of Systems  Constitutional:  Negative for activity change, chills, fatigue and fever.  HENT:  Negative for postnasal drip, rhinorrhea, sinus pressure and sinus pain.   Eyes: Negative.   Respiratory:  Negative for cough, chest tightness and shortness of breath.   Cardiovascular:  Negative for chest pain and palpitations.  Gastrointestinal:  Negative for constipation, diarrhea, nausea and vomiting.  Endocrine: Negative for cold intolerance, heat intolerance, polydipsia, polyphagia and polyuria.  Musculoskeletal:  Positive for myalgias. Negative for back pain.       Left shoulder pain with decreased ROM and strength due to pain.   Skin:  Negative for rash.  Allergic/Immunologic: Positive for environmental allergies.  Neurological:  Negative for dizziness, weakness and headaches.  Psychiatric/Behavioral:  Negative for dysphoric mood. The Vazquez is not nervous/anxious.    Objective:   Today's Vitals   03/25/21 1414  BP: 128/84  Pulse: 90  Temp: 97.6 F (36.4 C)  SpO2: 96%  Weight: (!) 303 lb 6.4 oz (137.6  kg)  Height: 6' (1.829 m)   Body mass index is 41.15 kg/m.   Physical Exam Vitals and nursing note reviewed.  Constitutional:      Appearance: Normal appearance. He is well-developed. He is obese.  HENT:     Head: Normocephalic and atraumatic.     Mouth/Throat:     Mouth: Mucous membranes are moist.     Pharynx: Oropharynx is clear.  Eyes:     Pupils: Pupils are equal, round, and reactive to light.  Cardiovascular:     Rate and Rhythm: Normal rate and regular rhythm.     Pulses:  Normal pulses.     Heart sounds: Normal heart sounds.  Pulmonary:     Effort: Pulmonary effort is normal.     Breath sounds: Normal breath sounds.  Abdominal:     Palpations: Abdomen is soft.  Musculoskeletal:     Right shoulder: Normal.     Left shoulder: Tenderness present. Decreased range of motion. Decreased strength.       Arms:     Cervical back: Normal range of motion and neck supple.     Comments: There is tenderness with palpation over the [facility] joint and into the left bicep tendon. No bony abnormality or deformity is palpated. There is decreased ROM and strength of the left arm.   Lymphadenopathy:     Cervical: No cervical adenopathy.  Skin:    General: Skin is warm and dry.     Capillary Refill: Capillary refill takes less than 2 seconds.  Neurological:     General: No focal deficit present.     Mental Status: He is alert and oriented to person, place, and time.  Psychiatric:        Mood and Affect: Mood normal.        Behavior: Behavior normal.        Thought Content: Thought content normal.        Judgment: Judgment normal.    Assessment & Plan:  1. Encounter to establish care Appointment today to establish new primary care office. He will have routine, fasting labs and routine physical exam within the next two week.s   2. Chronic left shoulder pain Vazquez with pain in left shoulder for approximately two years. He has decreased ROM and strength of the left arm. Will refer to Dr., Marry Guan, orthopedics, for further evaluation and treatment.  - Ambulatory referral to Orthopedic Surgery    Problem List Items Addressed This Visit       Other   Encounter to establish care - Primary   Chronic left shoulder pain   Relevant Orders   Ambulatory referral to Orthopedic Surgery    Outpatient Encounter Medications as of 03/25/2021  Medication Sig   azithromycin (ZITHROMAX) 250 MG tablet Take one tablet daily by daily for 12 days. Take with food (Vazquez not taking:  Reported on 03/25/2021)   azithromycin (ZITHROMAX) 250 MG tablet TAKE 2 TABLETS BY MOUTH ON DAY 1, THEN TAKE 1 TABLET DAILY FOR THE NEXT 4 DAYS. MAY REPEAT IF NEEDED FOR CHRONIC SINUSITIS. (Vazquez not taking: Reported on 03/25/2021)   cetirizine (ZYRTEC) 10 MG tablet Take 1 tablet (10 mg total) by mouth daily. (Vazquez not taking: Reported on 03/25/2021)   clindamycin (CLEOCIN) 300 MG capsule Take one pill 3 times a day (Vazquez not taking: Reported on 03/25/2021)   cyclobenzaprine (FLEXERIL) 10 MG tablet Take 1/2 to 1 tablet po BID prn muscle pain/spasms (Vazquez not taking: Reported on 03/25/2021)   desoximetasone (  TOPICORT) 0.25 % cream Apply 1 application topically 2 (two) times daily. (Vazquez not taking: Reported on 03/25/2021)   doxycycline (VIBRA-TABS) 100 MG tablet Take 1 tablet (100 mg total) by mouth 2 (two) times daily. For 10 days. (Vazquez not taking: Reported on 03/25/2021)   DULoxetine (CYMBALTA) 20 MG capsule Take 1 capsule (20 mg total) by mouth daily. (Vazquez not taking: Reported on 03/25/2021)   EPINEPHrine 0.3 mg/0.3 mL IJ SOAJ injection Use as directed (Vazquez not taking: Reported on 03/25/2021)   fluticasone (FLONASE) 50 MCG/ACT nasal spray Use 2 two sprays in each nostril once daily (Vazquez not taking: Reported on 03/25/2021)   fluticasone (FLONASE) 50 MCG/ACT nasal spray Spray two sprays in each nostril once daily (Vazquez not taking: Reported on 03/25/2021)   ivermectin (STROMECTOL) 3 MG TABS tablet Take 8 tablets po daily for 5 days. (Vazquez not taking: Reported on 03/25/2021)   ketorolac (TORADOL) 10 MG tablet Take 1 tablet (10 mg total) by mouth every 6 (six) hours as needed. (Vazquez not taking: Reported on 03/25/2021)   phentermine 37.5 MG capsule Take 1 capsule (37.5 mg total) by mouth every morning. (Vazquez not taking: Reported on 03/25/2021)   predniSONE (DELTASONE) 10 MG tablet take as directed 12 day taper pack (Vazquez not taking: Reported on 03/25/2021)   predniSONE  (DELTASONE) 10 MG tablet TAKE 6 TABLETS BY MOUTH DAILY FOR 4 DAYS,THEN 4 TABS DAILY FOR 4 DAYS,THEN 2 TABS DAILY FOR 4 DAYS,THEN STOP (Vazquez not taking: Reported on 03/25/2021)   predniSONE (STERAPRED UNI-PAK 48 TAB) 10 MG (48) TBPK tablet 12 day taper - take by mouth as directed for 12 days (Vazquez not taking: Reported on 03/25/2021)   sodium fluoride (PREVIDENT 5000 PLUS) 1.1 % CREA dental cream USE 1 TO 2 TIMES DAILY AS DIRECTED (Vazquez not taking: Reported on 03/25/2021)   tamsulosin (FLOMAX) 0.4 MG CAPS capsule Take 1 capsule (0.4 mg total) by mouth daily. (Vazquez not taking: Reported on 03/25/2021)   terbinafine (LAMISIL) 250 MG tablet Take 1 tablet (250 mg total) by mouth daily. (Vazquez not taking: No sig reported)   terbinafine (LAMISIL) 250 MG tablet Take 1 tablet (250 mg total) by mouth daily. (Vazquez not taking: No sig reported)   No facility-administered encounter medications on file as of 03/25/2021.    Follow-up: Return in about 2 weeks (around 04/08/2021) for health maintenance exam with FBW attime of visit. Marland Kitchen   Ronnell Freshwater, NP

## 2021-03-30 ENCOUNTER — Other Ambulatory Visit: Payer: Self-pay

## 2021-04-05 DIAGNOSIS — Z7689 Persons encountering health services in other specified circumstances: Secondary | ICD-10-CM | POA: Insufficient documentation

## 2021-04-05 DIAGNOSIS — M25512 Pain in left shoulder: Secondary | ICD-10-CM | POA: Insufficient documentation

## 2021-04-07 ENCOUNTER — Ambulatory Visit (INDEPENDENT_AMBULATORY_CARE_PROVIDER_SITE_OTHER): Payer: 59 | Admitting: Nurse Practitioner

## 2021-04-07 ENCOUNTER — Other Ambulatory Visit: Payer: Self-pay

## 2021-04-07 ENCOUNTER — Encounter: Payer: Self-pay | Admitting: Nurse Practitioner

## 2021-04-07 VITALS — BP 121/83 | HR 76 | Temp 98.0°F | Ht 73.0 in | Wt 300.0 lb

## 2021-04-07 DIAGNOSIS — M25512 Pain in left shoulder: Secondary | ICD-10-CM

## 2021-04-07 DIAGNOSIS — Z23 Encounter for immunization: Secondary | ICD-10-CM | POA: Diagnosis not present

## 2021-04-07 DIAGNOSIS — G8929 Other chronic pain: Secondary | ICD-10-CM | POA: Diagnosis not present

## 2021-04-07 DIAGNOSIS — Z6839 Body mass index (BMI) 39.0-39.9, adult: Secondary | ICD-10-CM

## 2021-04-07 DIAGNOSIS — Z114 Encounter for screening for human immunodeficiency virus [HIV]: Secondary | ICD-10-CM | POA: Diagnosis not present

## 2021-04-07 DIAGNOSIS — Z0000 Encounter for general adult medical examination without abnormal findings: Secondary | ICD-10-CM | POA: Diagnosis not present

## 2021-04-07 DIAGNOSIS — Z1159 Encounter for screening for other viral diseases: Secondary | ICD-10-CM

## 2021-04-07 DIAGNOSIS — Z0001 Encounter for general adult medical examination with abnormal findings: Secondary | ICD-10-CM

## 2021-04-07 NOTE — Progress Notes (Signed)
Established Patient Office Visit  Subjective:  Patient ID: Edwin Vazquez, male    DOB: [birth year]  Age: 45 y.o. 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  He is reporting no physical concerns or complaints at this time.  He denies chest pain, chest pressure, or shortness of breath. He denies headaches or visual disturbances. He denies abdominal pain, nausea, vomiting, or changes in bowel or bladder habits.  He would like to get his tetanus vaccine while in the office today.  He does need to have routine, fasting lab work done.  He would like to add screening for HIV and hepatitis C while getting his routine lab work done.   No past medical history on file.  No past surgical history on file.  No family history on file.  Social History   Socioeconomic History   Marital status: Married    Spouse name: Not on file   Number of children: Not on file   Years of education: Not on file   Highest education level: Not on file  Occupational History   Not on file  Tobacco Use   Smoking status: Never   Smokeless tobacco: Never  Substance and Sexual Activity   Alcohol use: Never   Drug use: Never   Sexual activity: Yes  Other Topics Concern   Not on file  Social History Narrative   Not on file   Social Determinants of Health   Financial Resource Strain: Not on file  Food Insecurity: Not on file  Transportation Needs: Not on file  Physical Activity: Not on file  Stress: Not on file  Social Connections: Not on file  Intimate Partner Violence: Not on file    Outpatient Medications Prior to Visit  Medication Sig Dispense Refill   fluticasone (FLONASE) 50 MCG/ACT nasal spray Use 2 two sprays in each nostril once daily (Patient not taking: Reported on 03/25/2021) 16 g 10   fluticasone (FLONASE) 50 MCG/ACT nasal spray Spray two sprays in each nostril once daily (Patient not  taking: Reported on 03/25/2021) 16 g 10   azithromycin (ZITHROMAX) 250 MG tablet Take one tablet daily by daily for 12 days. Take with food (Patient not taking: Reported on 03/25/2021) 12 tablet 0   azithromycin (ZITHROMAX) 250 MG tablet TAKE 2 TABLETS BY MOUTH ON DAY 1, THEN TAKE 1 TABLET DAILY FOR THE NEXT 4 DAYS. MAY REPEAT IF NEEDED FOR CHRONIC SINUSITIS. (Patient not taking: Reported on 03/25/2021) 12 tablet 0   cetirizine (ZYRTEC) 10 MG tablet Take 1 tablet (10 mg total) by mouth daily. (Patient not taking: Reported on 03/25/2021) 90 tablet 10   clindamycin (CLEOCIN) 300 MG capsule Take one pill 3 times a day (Patient not taking: Reported on 03/25/2021) 63 capsule 0   cyclobenzaprine (FLEXERIL) 10 MG tablet Take 1/2 to 1 tablet po BID prn muscle pain/spasms (Patient not taking: Reported on 03/25/2021) 45 tablet 2   desoximetasone (TOPICORT) 0.25 % cream Apply 1 application topically 2 (two) times daily. (Patient not taking: Reported on 03/25/2021) 30 g 2   doxycycline (VIBRA-TABS) 100 MG tablet Take 1 tablet (100 mg total) by mouth 2 (two) times daily. For 10 days. (Patient not taking: Reported on 03/25/2021) 20 tablet 0   DULoxetine (CYMBALTA) 20 MG capsule Take 1 capsule (20 mg total) by mouth daily. (Patient not taking: Reported on 03/25/2021) 30 capsule 3  EPINEPHrine 0.3 mg/0.3 mL IJ SOAJ injection Use as directed (Patient not taking: Reported on 03/25/2021) 2 each 1   ivermectin (STROMECTOL) 3 MG TABS tablet Take 8 tablets po daily for 5 days. (Patient not taking: Reported on 03/25/2021) 40 tablet 0   ketorolac (TORADOL) 10 MG tablet Take 1 tablet (10 mg total) by mouth every 6 (six) hours as needed. (Patient not taking: Reported on 03/25/2021) 30 tablet 0   phentermine 37.5 MG capsule Take 1 capsule (37.5 mg total) by mouth every morning. (Patient not taking: Reported on 03/25/2021) 30 capsule 2   predniSONE (DELTASONE) 10 MG tablet take as directed 12 day taper pack (Patient not taking: Reported on  03/25/2021) 48 tablet 0   predniSONE (DELTASONE) 10 MG tablet TAKE 6 TABLETS BY MOUTH DAILY FOR 4 DAYS,THEN 4 TABS DAILY FOR 4 DAYS,THEN 2 TABS DAILY FOR 4 DAYS,THEN STOP (Patient not taking: Reported on 03/25/2021) 48 tablet 0   predniSONE (STERAPRED UNI-PAK 48 TAB) 10 MG (48) TBPK tablet 12 day taper - take by mouth as directed for 12 days (Patient not taking: Reported on 03/25/2021) 48 tablet 0   sodium fluoride (PREVIDENT 5000 PLUS) 1.1 % CREA dental cream USE 1 TO 2 TIMES DAILY AS DIRECTED (Patient not taking: Reported on 03/25/2021) 51 g 1   tamsulosin (FLOMAX) 0.4 MG CAPS capsule Take 1 capsule (0.4 mg total) by mouth daily. (Patient not taking: Reported on 03/25/2021) 30 capsule 0   terbinafine (LAMISIL) 250 MG tablet Take 1 tablet (250 mg total) by mouth daily. (Patient not taking: No sig reported) 14 tablet 0   terbinafine (LAMISIL) 250 MG tablet Take 1 tablet (250 mg total) by mouth daily. (Patient not taking: No sig reported) 14 tablet 0   No facility-administered medications prior to visit.    Allergies  Allergen Reactions   Latex Itching   Iodinated Diagnostic Agents Hives and Itching    "itching, burning" sensation    ROS Review of Systems  Constitutional:  Negative for activity change, appetite change, chills, fatigue and fever.  HENT:  Negative for congestion, postnasal drip, rhinorrhea, sinus pressure and sinus pain.   Eyes: Negative.   Respiratory:  Negative for cough, chest tightness, shortness of breath and wheezing.   Cardiovascular:  Negative for chest pain and palpitations.  Gastrointestinal:  Negative for constipation, diarrhea, nausea and vomiting.  Endocrine: Negative for cold intolerance, heat intolerance, polydipsia and polyuria.  Genitourinary:  Negative for dysuria and urgency.  Musculoskeletal:  Positive for arthralgias. Negative for back pain and myalgias.       Chronic left shoulder pain.  Unchanged since initial visit.  Waiting for orthopedic referral to go  through.  Skin:  Negative for rash.  Allergic/Immunologic: Negative.   Neurological:  Negative for dizziness, weakness and headaches.  Hematological: Negative.   Psychiatric/Behavioral:  The patient is not nervous/anxious.      Objective:    Physical Exam Vitals and nursing note reviewed.  Constitutional:      Appearance: Normal appearance. He is well-developed. He is obese.  HENT:     Head: Normocephalic and atraumatic.     Right Ear: Ear canal and external ear normal.     Left Ear: Ear canal and external ear normal.     Nose: Nose normal.     Mouth/Throat:     Mouth: Mucous membranes are moist.     Pharynx: Oropharynx is clear.  Eyes:     Extraocular Movements: Extraocular movements intact.  Conjunctiva/sclera: Conjunctivae normal.     Pupils: Pupils are equal, round, and reactive to light.  Cardiovascular:     Rate and Rhythm: Normal rate and regular rhythm.     Pulses: Normal pulses.     Heart sounds: Normal heart sounds.  Pulmonary:     Effort: Pulmonary effort is normal.     Breath sounds: Normal breath sounds.  Abdominal:     General: Bowel sounds are normal.     Palpations: Abdomen is soft.     Tenderness: There is no abdominal tenderness.  Musculoskeletal:        General: Normal range of motion.     Cervical back: Normal range of motion and neck supple.  Skin:    General: Skin is warm and dry.     Capillary Refill: Capillary refill takes less than 2 seconds.  Neurological:     General: No focal deficit present.     Mental Status: He is alert and oriented to person, place, and time.     Cranial Nerves: No cranial nerve deficit.     Sensory: No sensory deficit.     Motor: No weakness.     Coordination: Coordination normal.  Psychiatric:        Mood and Affect: Mood normal.        Behavior: Behavior normal.        Thought Content: Thought content normal.        Judgment: Judgment normal.   Today's Vitals   04/07/21 1130  BP: 121/83  Pulse: 76   Temp: 98 F (36.7 C)  SpO2: 94%  Weight: 300 lb (136.1 kg)  Height: _0  (1.854 m)   Body mass index is 39.58 kg/m.   Wt Readings from Last 3 Encounters:  04/07/21 300 lb (136.1 kg)  03/25/21 (!) 303 lb 6.4 oz (137.6 kg)  03/27/19 279 lb 9.6 oz (126.8 kg)     There are no preventive care reminders to display for this patient.   There are no preventive care reminders to display for this patient.  Lab Results  Component Value Date   TSH 2.220 04/07/2021   Lab Results  Component Value Date   WBC 6.8 04/07/2021   HGB 16.4 04/07/2021   HCT 49.2 04/07/2021   MCV 85 04/07/2021   PLT 215 04/07/2021   Lab Results  Component Value Date   NA 141 04/07/2021   K 4.6 04/07/2021   CO2 22 04/07/2021   GLUCOSE 91 04/07/2021   BUN 12 04/07/2021   CREATININE 0.97 04/07/2021   BILITOT 0.6 04/07/2021   ALKPHOS 76 04/07/2021   AST 97 (H) 04/07/2021   ALT 158 (H) 04/07/2021   PROT 6.8 04/07/2021   ALBUMIN 4.6 04/07/2021   CALCIUM 9.7 04/07/2021   ANIONGAP 6 09/03/2018   EGFR 99 04/07/2021   Lab Results  Component Value Date   CHOL 202 (H) 04/07/2021   Lab Results  Component Value Date   HDL 33 (L) 04/07/2021   Lab Results  Component Value Date   LDLCALC 151 (H) 04/07/2021   Lab Results  Component Value Date   TRIG 97 04/07/2021   Lab Results  Component Value Date   CHOLHDL 6.1 (H) 04/07/2021   No results found for: HGBA1C    Assessment & Plan:  1. Encounter for general adult medical examination with abnormal findings Annual wellness visit today. - CBC with Differential/Platelet - Comprehensive metabolic panel - Lipid panel - TSH - HIV antibody (with reflex) -  Hepatitis C antibody  2. BMI 39.0-39.9,adult Discussed limiting calorie intake to 2000 cal/day or less per day.  Encouraged him to gradually incorporate exercise into daily routine.  We will follow routinely.  3. Need for Tdap vaccination Tetanus vaccine administered during today's visit. -  Tdap vaccine greater than or equal to 7yo IM  4. Need for hepatitis C screening test Screen for hepatitis C done during today's visit. - Hepatitis C antibody  5. Encounter for screening for HIV Screening for HIV with today's labs. - HIV antibody (with reflex)  6. Chronic left shoulder pain Patient awaiting referral to Dr. Marry Guan for further evaluation and treatment.  Problem List Items Addressed This Visit       Other   Chronic left shoulder pain   Encounter for general adult medical examination with abnormal findings - Primary   Relevant Orders   CBC with Differential/Platelet (Completed)   Comprehensive metabolic panel (Completed)   Lipid panel (Completed)   TSH (Completed)   HIV antibody (with reflex)   Hepatitis C antibody (Completed)   BMI 39.0-39.9,adult   Need for Tdap vaccination   Relevant Orders   Tdap vaccine greater than or equal to 7yo IM (Completed)   Need for hepatitis C screening test   Relevant Orders   Hepatitis C antibody (Completed)   Encounter for screening for HIV   Relevant Orders   HIV antibody (with reflex)    No orders of the defined types were placed in this encounter.   Follow-up: Return in about 1 year (around 04/07/2022) for health maintenance exam - FBW at time of visit .    Ronnell Freshwater, NP

## 2021-04-08 LAB — COMPREHENSIVE METABOLIC PANEL
ALT: 158 IU/L — ABNORMAL HIGH (ref 0–44)
AST: 97 IU/L — ABNORMAL HIGH (ref 0–40)
Albumin/Globulin Ratio: 2.1 (ref 1.2–2.2)
Albumin: 4.6 g/dL (ref 4.0–5.0)
Alkaline Phosphatase: 76 IU/L (ref 44–121)
BUN/Creatinine Ratio: 12 (ref 9–20)
BUN: 12 mg/dL (ref 6–24)
Bilirubin Total: 0.6 mg/dL (ref 0.0–1.2)
CO2: 22 mmol/L (ref 20–29)
Calcium: 9.7 mg/dL (ref 8.7–10.2)
Chloride: 104 mmol/L (ref 96–106)
Creatinine, Ser: 0.97 mg/dL (ref 0.76–1.27)
Globulin, Total: 2.2 g/dL (ref 1.5–4.5)
Glucose: 91 mg/dL (ref 65–99)
Potassium: 4.6 mmol/L (ref 3.5–5.2)
Sodium: 141 mmol/L (ref 134–144)
Total Protein: 6.8 g/dL (ref 6.0–8.5)
eGFR: 99 mL/min/{1.73_m2} (ref >59)

## 2021-04-08 LAB — CBC WITH DIFFERENTIAL/PLATELET
Basophils Absolute: 0 10*3/uL (ref 0.0–0.2)
Basos: 0 %
EOS (ABSOLUTE): 0.1 10*3/uL (ref 0.0–0.4)
Eos: 2 %
Hematocrit: 49.2 % (ref 37.5–51.0)
Hemoglobin: 16.4 g/dL (ref 13.0–17.7)
Immature Grans (Abs): 0 10*3/uL (ref 0.0–0.1)
Immature Granulocytes: 0 %
Lymphocytes Absolute: 2.7 10*3/uL (ref 0.7–3.1)
Lymphs: 39 %
MCH: 28.5 pg (ref 26.6–33.0)
MCHC: 33.3 g/dL (ref 31.5–35.7)
MCV: 85 fL (ref 79–97)
Monocytes Absolute: 0.7 10*3/uL (ref 0.1–0.9)
Monocytes: 11 %
Neutrophils Absolute: 3.3 10*3/uL (ref 1.4–7.0)
Neutrophils: 48 %
Platelets: 215 10*3/uL (ref 150–450)
RBC: 5.76 x10E6/uL (ref 4.14–5.80)
RDW: 13.1 % (ref 11.6–15.4)
WBC: 6.8 10*3/uL (ref 3.4–10.8)

## 2021-04-08 LAB — HIV ANTIBODY (ROUTINE TESTING W REFLEX): HIV Screen 4th Generation wRfx: NONREACTIVE

## 2021-04-08 LAB — LIPID PANEL
Chol/HDL Ratio: 6.1 ratio — ABNORMAL HIGH (ref 0.0–5.0)
Cholesterol, Total: 202 mg/dL — ABNORMAL HIGH (ref 100–199)
HDL: 33 mg/dL — ABNORMAL LOW (ref >39)
LDL Chol Calc (NIH): 151 mg/dL — ABNORMAL HIGH (ref 0–99)
Triglycerides: 97 mg/dL (ref 0–149)
VLDL Cholesterol Cal: 18 mg/dL (ref 5–40)

## 2021-04-08 LAB — TSH: TSH: 2.22 u[IU]/mL (ref 0.450–4.500)

## 2021-04-08 LAB — HEPATITIS C ANTIBODY: Hep C Virus Ab: 0.1 s/co ratio (ref 0.0–0.9)

## 2021-04-12 DIAGNOSIS — Z0001 Encounter for general adult medical examination with abnormal findings: Secondary | ICD-10-CM | POA: Insufficient documentation

## 2021-04-12 DIAGNOSIS — Z23 Encounter for immunization: Secondary | ICD-10-CM | POA: Insufficient documentation

## 2021-04-12 DIAGNOSIS — Z1159 Encounter for screening for other viral diseases: Secondary | ICD-10-CM | POA: Insufficient documentation

## 2021-04-12 DIAGNOSIS — Z6837 Body mass index (BMI) 37.0-37.9, adult: Secondary | ICD-10-CM | POA: Insufficient documentation

## 2021-04-12 DIAGNOSIS — Z114 Encounter for screening for human immunodeficiency virus [HIV]: Secondary | ICD-10-CM | POA: Insufficient documentation

## 2021-05-06 NOTE — Progress Notes (Signed)
Cholesterol panel elevated as are liver functions. These are slightly elevated from most recent check. May consider further evaluation if trend continues.

## 2021-08-18 ENCOUNTER — Other Ambulatory Visit: Payer: Self-pay

## 2021-08-18 MED ORDER — FINASTERIDE 1 MG PO TABS
ORAL_TABLET | ORAL | 3 refills | Status: AC
  Filled 2021-08-18 – 2021-08-31 (×2): qty 90, 90d supply, fill #0

## 2021-08-20 ENCOUNTER — Other Ambulatory Visit: Payer: Self-pay

## 2021-08-20 ENCOUNTER — Ambulatory Visit: Payer: 59 | Admitting: Nurse Practitioner

## 2021-08-20 ENCOUNTER — Encounter: Payer: Self-pay | Admitting: Nurse Practitioner

## 2021-08-20 VITALS — BP 106/71 | HR 80 | Temp 98.2°F | Ht 73.0 in | Wt 287.7 lb

## 2021-08-20 DIAGNOSIS — R634 Abnormal weight loss: Secondary | ICD-10-CM

## 2021-08-20 DIAGNOSIS — Z6837 Body mass index (BMI) 37.0-37.9, adult: Secondary | ICD-10-CM

## 2021-08-20 MED ORDER — PHENTERMINE HCL 37.5 MG PO CAPS
37.5000 mg | ORAL_CAPSULE | ORAL | 1 refills | Status: AC
  Filled 2021-08-20 – 2021-08-31 (×2): qty 30, 30d supply, fill #0

## 2021-08-20 NOTE — Patient Instructions (Addendum)
Fat and Cholesterol Restricted Eating Plan Getting too much fat and cholesterol in your diet may cause health problems. Choosing the right foods helps keep your fat and cholesterol at normal levels. This can keep you from getting certain diseases. Your doctor may recommend an eating plan that includes: Total fat: ______% or less of total calories a day. This is ______g of fat a day. Saturated fat: ______% or less of total calories a day. This is ______g of saturated fat a day. Cholesterol: less than _________mg a day. Fiber: ______g a day. What are tips for following this plan? General tips Work with your doctor to lose weight if you need to. Avoid: Foods with added sugar. Fried foods. Foods with trans fat or partially hydrogenated oils. This includes some margarines and baked goods. If you drink alcohol: Limit how much you have to: 0-1 drink a day for women who are not pregnant. 0-2 drinks a day for men. Know how much alcohol is in a drink. In the [place] one drink equals one 12 oz bottle of beer (355 mL), one 5 oz glass of wine (148 mL), or one 1 oz glass of hard liquor (44 mL). Reading food labels Check food labels for: Trans fats. Partially hydrogenated oils. Saturated fat (g) in each serving. Cholesterol (mg) in each serving. Fiber (g) in each serving. Choose foods with healthy fats, such as: Monounsaturated fats and polyunsaturated fats. These include olive and canola oil, flaxseeds, walnuts, almonds, and seeds. Omega-3 fats. These are found in certain fish, flaxseed oil, and ground flaxseeds. Choose grain products that have whole grains. Look for the word "whole" as the first word in the ingredient list. Cooking Cook foods using low-fat methods. These include baking, boiling, grilling, and broiling. Eat more home-cooked foods. Eat at restaurants and buffets less often. Eat less fast food. Avoid cooking using saturated fats, such as butter, cream, palm oil, palm kernel oil, and  coconut oil. Meal planning  At meals, divide your plate into four equal parts: Fill one-half of your plate with vegetables, green salads, and fruit. Fill one-fourth of your plate with whole grains. Fill one-fourth of your plate with low-fat (lean) protein foods. Eat fish that is high in omega-3 fats at least two times a week. This includes mackerel, tuna, sardines, and salmon. Eat foods that are high in fiber, such as whole grains, beans, apples, pears, berries, broccoli, carrots, peas, and barley. What foods should I eat? Fruits All fresh, canned (in natural juice), or frozen fruits. Vegetables Fresh or frozen vegetables (raw, steamed, roasted, or grilled). Green salads. Grains Whole grains, such as whole wheat or whole grain breads, crackers, cereals, and pasta. Unsweetened oatmeal, bulgur, barley, quinoa, or brown rice. Corn or whole wheat flour tortillas. Meats and other protein foods Ground beef (85% or leaner), grass-fed beef, or beef trimmed of fat. Skinless chicken or turkey. Ground chicken or turkey. Pork trimmed of fat. All fish and seafood. Egg whites. Dried beans, peas, or lentils. Unsalted nuts or seeds. Unsalted canned beans. Nut butters without added sugar or oil. Dairy Low-fat or nonfat dairy products, such as skim or 1% milk, 2% or reduced-fat cheeses, low-fat and fat-free ricotta or cottage cheese, or plain low-fat and nonfat yogurt. Fats and oils Tub margarine without trans fats. Light or reduced-fat mayonnaise and salad dressings. Avocado. Olive, canola, sesame, or safflower oils. The items listed above may not be a complete list of foods and beverages you can eat. Contact a dietitian for more information. What foods   should I avoid? Fruits Canned fruit in heavy syrup. Fruit in cream or butter sauce. Fried fruit. Vegetables Vegetables cooked in cheese, cream, or butter sauce. Fried vegetables. Grains White bread. White pasta. White rice. Cornbread. Bagels, pastries,  and croissants. Crackers and snack foods that contain trans fat and hydrogenated oils. Meats and other protein foods Fatty cuts of meat. Ribs, chicken wings, bacon, sausage, bologna, salami, chitterlings, fatback, hot dogs, bratwurst, and packaged lunch meats. Liver and organ meats. Whole eggs and egg yolks. Chicken and [place] with skin. Fried meat. Dairy Whole or 2% milk, cream, half-and-half, and cream cheese. Whole milk cheeses. Whole-fat or sweetened yogurt. Full-fat cheeses. Nondairy creamers and whipped toppings. Processed cheese, cheese spreads, and cheese curds. Fats and oils Butter, stick margarine, lard, shortening, ghee, or bacon fat. Coconut, palm kernel, and palm oils. Beverages Alcohol. Sugar-sweetened drinks such as sodas, lemonade, and fruit drinks. Sweets and desserts Corn syrup, sugars, honey, and molasses. Candy. Jam and jelly. Syrup. Sweetened cereals. Cookies, pies, cakes, donuts, muffins, and ice cream. The items listed above may not be a complete list of foods and beverages you should avoid. Contact a dietitian for more information. Summary Choosing the right foods helps keep your fat and cholesterol at normal levels. This can keep you from getting certain diseases. At meals, fill one-half of your plate with vegetables, green salads, and fruits. Eat high fiber foods, like whole grains, beans, apples, pears, berries, carrots, peas, and barley. Limit added sugar, saturated fats, alcohol, and fried foods. This information is not intended to replace advice given to you by your health care provider. Make sure you discuss any questions you have with your health care provider. Document Revised: 02/05/2021 Document Reviewed: 02/05/2021 Elsevier Patient Education  [place] Calories are units of energy. Your body needs a certain number of calories from food to keep going throughout the day. When you eat or drink more calories than your  body needs, your body stores the extra calories mostly as fat. When you eat or drink fewer calories than your body needs, your body burns fat to get the energy it needs. Calorie counting means keeping track of how many calories you eat and drink each day. Calorie counting can be helpful if you need to lose weight. If you eat fewer calories than your body needs, you should lose weight. Ask your health care provider what a healthy weight is for you. For calorie counting to work, you will need to eat the right number of calories each day to lose a healthy amount of weight per week. A dietitian can help you figure out how many calories you need in a day and will suggest ways to reach your calorie goal. A healthy amount of weight to lose each week is usually 1-2 lb (0.5-0.9 kg). This usually means that your daily calorie intake should be reduced by 500-750 calories. Eating 1,200-1,500 calories a day can help most women lose weight. Eating 1,500-1,800 calories a day can help most men lose weight. What do I need to know about calorie counting? Work with your health care provider or dietitian to determine how many calories you should get each day. To meet your daily calorie goal, you will need to: Find out how many calories are in each food that you would like to eat. Try to do this before you eat. Decide how much of the food you plan to eat. Keep a food log. Do this by writing down  what you ate and how many calories it had. To successfully lose weight, it is important to balance calorie counting with a healthy lifestyle that includes regular activity. Where do I find calorie information? The number of calories in a food can be found on a Nutrition Facts label. If a food does not have a Nutrition Facts label, try to look up the calories online or ask your dietitian for help. Remember that calories are listed per serving. If you choose to have more than one serving of a food, you will have to multiply the  calories per serving by the number of servings you plan to eat. For example, the label on a package of bread might say that a serving size is 1 slice and that there are 90 calories in a serving. If you eat 1 slice, you will have eaten 90 calories. If you eat 2 slices, you will have eaten 180 calories. How do I keep a food log? After each time that you eat, record the following in your food log as soon as possible: What you ate. Be sure to include toppings, sauces, and other extras on the food. How much you ate. This can be measured in cups, ounces, or number of items. How many calories were in each food and drink. The total number of calories in the food you ate. Keep your food log near you, such as in a pocket-sized notebook or on an app or website on your mobile phone. Some programs will calculate calories for you and show you how many calories you have left to meet your daily goal. What are some portion-control tips? Know how many calories are in a serving. This will help you know how many servings you can have of a certain food. Use a measuring cup to measure serving sizes. You could also try weighing out portions on a kitchen scale. With time, you will be able to estimate serving sizes for some foods. Take time to put servings of different foods on your favorite plates or in your favorite bowls and cups so you know what a serving looks like. Try not to eat straight from a food's packaging, such as from a bag or box. Eating straight from the package makes it hard to see how much you are eating and can lead to overeating. Put the amount you would like to eat in a cup or on a plate to make sure you are eating the right portion. Use smaller plates, glasses, and bowls for smaller portions and to prevent overeating. Try not to multitask. For example, avoid watching TV or using your computer while eating. If it is time to eat, sit down at a table and enjoy your food. This will help you recognize when you  are full. It will also help you be more mindful of what and how much you are eating. What are tips for following this plan? Reading food labels Check the calorie count compared with the serving size. The serving size may be smaller than what you are used to eating. Check the source of the calories. Try to choose foods that are high in protein, fiber, and vitamins, and low in saturated fat, trans fat, and sodium. Shopping Read nutrition labels while you shop. This will help you make healthy decisions about which foods to buy. Pay attention to nutrition labels for low-fat or fat-free foods. These foods sometimes have the same number of calories or more calories than the full-fat versions. They also often have added  sugar, starch, or salt to make up for flavor that was removed with the fat. Make a grocery list of lower-calorie foods and stick to it. Cooking Try to cook your favorite foods in a healthier way. For example, try baking instead of frying. Use low-fat dairy products. Meal planning Use more fruits and vegetables. One-half of your plate should be fruits and vegetables. Include lean proteins, such as chicken, [place], and fish. Lifestyle Each week, aim to do one of the following: 150 minutes of moderate exercise, such as walking. 75 minutes of vigorous exercise, such as running. General information Know how many calories are in the foods you eat most often. This will help you calculate calorie counts faster. Find a way of tracking calories that works for you. Get creative. Try different apps or programs if writing down calories does not work for you. What foods should I eat?  Eat nutritious foods. It is better to have a nutritious, high-calorie food, such as an avocado, than a food with few nutrients, such as a bag of potato chips. Use your calories on foods and drinks that will fill you up and will not leave you hungry soon after eating. Examples of foods that fill you up are nuts and  nut butters, vegetables, lean proteins, and high-fiber foods such as whole grains. High-fiber foods are foods with more than 5 g of fiber per serving. Pay attention to calories in drinks. Low-calorie drinks include water and unsweetened drinks. The items listed above may not be a complete list of foods and beverages you can eat. Contact a dietitian for more information. What foods should I limit? Limit foods or drinks that are not good sources of vitamins, minerals, or protein or that are high in unhealthy fats. These include: Candy. Other sweets. Sodas, specialty coffee drinks, alcohol, and juice. The items listed above may not be a complete list of foods and beverages you should avoid. Contact a dietitian for more information. How do I count calories when eating out? Pay attention to portions. Often, portions are much larger when eating out. Try these tips to keep portions smaller: Consider sharing a meal instead of getting your own. If you get your own meal, eat only half of it. Before you start eating, ask for a container and put half of your meal into it. When available, consider ordering smaller portions from the menu instead of full portions. Pay attention to your food and drink choices. Knowing the way food is cooked and what is included with the meal can help you eat fewer calories. If calories are listed on the menu, choose the lower-calorie options. Choose dishes that include vegetables, fruits, whole grains, low-fat dairy products, and lean proteins. Choose items that are boiled, broiled, grilled, or steamed. Avoid items that are buttered, battered, fried, or served with cream sauce. Items labeled as crispy are usually fried, unless stated otherwise. Choose water, low-fat milk, unsweetened iced tea, or other drinks without added sugar. If you want an alcoholic beverage, choose a lower-calorie option, such as a glass of wine or light beer. Ask for dressings, sauces, and syrups on the  side. These are usually high in calories, so you should limit the amount you eat. If you want a salad, choose a garden salad and ask for grilled meats. Avoid extra toppings such as bacon, cheese, or fried items. Ask for the dressing on the side, or ask for olive oil and vinegar or lemon to use as dressing. Estimate how many servings of  a food you are given. Knowing serving sizes will help you be aware of how much food you are eating at restaurants. Where to find more information Centers for Disease Control and Prevention: http://www.wolf.info/ [place] Department of Agriculture: http://www.wilson-mendoza.org/ Summary Calorie counting means keeping track of how many calories you eat and drink each day. If you eat fewer calories than your body needs, you should lose weight. A healthy amount of weight to lose per week is usually 1-2 lb (0.5-0.9 kg). This usually means reducing your daily calorie intake by 500-750 calories. The number of calories in a food can be found on a Nutrition Facts label. If a food does not have a Nutrition Facts label, try to look up the calories online or ask your dietitian for help. Use smaller plates, glasses, and bowls for smaller portions and to prevent overeating. Use your calories on foods and drinks that will fill you up and not leave you hungry shortly after a meal. This information is not intended to replace advice given to you by your health care provider. Make sure you discuss any questions you have with your health care provider. Document Revised: 11/07/2019 Document Reviewed: 11/07/2019 Elsevier Patient Education  2022 [place] and Cholesterol Restricted Eating Plan Getting too much fat and cholesterol in your diet may cause health problems. Choosing the right foods helps keep your fat and cholesterol at normal levels. This can keep you from getting certain diseases. Your doctor may recommend an eating plan that includes: Total fat: ______% or less of total calories a day. This is  ______g of fat a day. Saturated fat: ______% or less of total calories a day. This is ______g of saturated fat a day. Cholesterol: less than _________mg a day. Fiber: ______g a day. What are tips for following this plan? General tips Work with your doctor to lose weight if you need to. Avoid: Foods with added sugar. Fried foods. Foods with trans fat or partially hydrogenated oils. This includes some margarines and baked goods. If you drink alcohol: Limit how much you have to: 0-1 drink a day for women who are not pregnant. 0-2 drinks a day for men. Know how much alcohol is in a drink. In the [place] one drink equals one 12 oz bottle of beer (355 mL), one 5 oz glass of wine (148 mL), or one 1 oz glass of hard liquor (44 mL). Reading food labels Check food labels for: Trans fats. Partially hydrogenated oils. Saturated fat (g) in each serving. Cholesterol (mg) in each serving. Fiber (g) in each serving. Choose foods with healthy fats, such as: Monounsaturated fats and polyunsaturated fats. These include olive and canola oil, flaxseeds, walnuts, almonds, and seeds. Omega-3 fats. These are found in certain fish, flaxseed oil, and ground flaxseeds. Choose grain products that have whole grains. Look for the word "whole" as the first word in the ingredient list. Cooking Cook foods using low-fat methods. These include baking, boiling, grilling, and broiling. Eat more home-cooked foods. Eat at restaurants and buffets less often. Eat less fast food. Avoid cooking using saturated fats, such as butter, cream, palm oil, palm kernel oil, and coconut oil. Meal planning  At meals, divide your plate into four equal parts: Fill one-half of your plate with vegetables, green salads, and fruit. Fill one-fourth of your plate with whole grains. Fill one-fourth of your plate with low-fat (lean) protein foods. Eat fish that is high in omega-3 fats at least two times a week. This includes mackerel, tuna,  sardines, and salmon. Eat foods that are high in fiber, such as whole grains, beans, apples, pears, berries, broccoli, carrots, peas, and barley. What foods should I eat? Fruits All fresh, canned (in natural juice), or frozen fruits. Vegetables Fresh or frozen vegetables (raw, steamed, roasted, or grilled). Green salads. Grains Whole grains, such as whole wheat or whole grain breads, crackers, cereals, and pasta. Unsweetened oatmeal, bulgur, barley, quinoa, or brown rice. Corn or whole wheat flour tortillas. Meats and other protein foods Ground beef (85% or leaner), grass-fed beef, or beef trimmed of fat. Skinless chicken or [place]. Ground chicken or [place]. Pork trimmed of fat. All fish and seafood. Egg whites. Dried beans, peas, or lentils. Unsalted nuts or seeds. Unsalted canned beans. Nut butters without added sugar or oil. Dairy Low-fat or nonfat dairy products, such as skim or 1% milk, 2% or reduced-fat cheeses, low-fat and fat-free ricotta or cottage cheese, or plain low-fat and nonfat yogurt. Fats and oils Tub margarine without trans fats. Light or reduced-fat mayonnaise and salad dressings. Avocado. Olive, canola, sesame, or safflower oils. The items listed above may not be a complete list of foods and beverages you can eat. Contact a dietitian for more information. What foods should I avoid? Fruits Canned fruit in heavy syrup. Fruit in cream or butter sauce. Fried fruit. Vegetables Vegetables cooked in cheese, cream, or butter sauce. Fried vegetables. Grains White bread. White pasta. White rice. Cornbread. Bagels, pastries, and croissants. Crackers and snack foods that contain trans fat and hydrogenated oils. Meats and other protein foods Fatty cuts of meat. Ribs, chicken wings, bacon, sausage, bologna, salami, chitterlings, fatback, hot dogs, bratwurst, and packaged lunch meats. Liver and organ meats. Whole eggs and egg yolks. Chicken and [place] with skin. Fried meat. Dairy Whole  or 2% milk, cream, half-and-half, and cream cheese. Whole milk cheeses. Whole-fat or sweetened yogurt. Full-fat cheeses. Nondairy creamers and whipped toppings. Processed cheese, cheese spreads, and cheese curds. Fats and oils Butter, stick margarine, lard, shortening, ghee, or bacon fat. Coconut, palm kernel, and palm oils. Beverages Alcohol. Sugar-sweetened drinks such as sodas, lemonade, and fruit drinks. Sweets and desserts Corn syrup, sugars, honey, and molasses. Candy. Jam and jelly. Syrup. Sweetened cereals. Cookies, pies, cakes, donuts, muffins, and ice cream. The items listed above may not be a complete list of foods and beverages you should avoid. Contact a dietitian for more information. Summary Choosing the right foods helps keep your fat and cholesterol at normal levels. This can keep you from getting certain diseases. At meals, fill one-half of your plate with vegetables, green salads, and fruits. Eat high fiber foods, like whole grains, beans, apples, pears, berries, carrots, peas, and barley. Limit added sugar, saturated fats, alcohol, and fried foods. This information is not intended to replace advice given to you by your health care provider. Make sure you discuss any questions you have with your health care provider. Document Revised: 02/05/2021 Document Reviewed: 02/05/2021 Elsevier Patient Education  [place].

## 2021-08-20 NOTE — Progress Notes (Addendum)
Established Patient Office Visit  Subjective:  Patient ID: Edwin Vazquez, male    DOB: Feb 25, 1976  Age: 45 y.o. MRN: [id number]  CC:  Chief Complaint  Patient presents with   Medication Refill    HPI Edwin Vazquez presents for weight management. Had started on old prescription for phentermine. Finds that it is working well. Has lost 19 pounds since he restarted old medication. Has been on this medication in the past. He has no negative side effects to report.   History reviewed. No pertinent past medical history.  History reviewed. No pertinent surgical history.  History reviewed. No pertinent family history.  Social History   Socioeconomic History   Marital status: Married    Spouse name: Not on file   Number of children: Not on file   Years of education: Not on file   Highest education level: Not on file  Occupational History   Not on file  Tobacco Use   Smoking status: Never   Smokeless tobacco: Never  Substance and Sexual Activity   Alcohol use: Never   Drug use: Never   Sexual activity: Yes  Other Topics Concern   Not on file  Social History Narrative   Not on file   Social Determinants of Health   Financial Resource Strain: Not on file  Food Insecurity: Not on file  Transportation Needs: Not on file  Physical Activity: Not on file  Stress: Not on file  Social Connections: Not on file  Intimate Partner Violence: Not on file    Outpatient Medications Prior to Visit  Medication Sig Dispense Refill   finasteride (PROPECIA) 1 MG tablet 1 tab(s) oral once a day 90 tablet 3   fluticasone (FLONASE) 50 MCG/ACT nasal spray Use 2 two sprays in each nostril once daily (Patient not taking: Reported on 03/25/2021) 16 g 10   fluticasone (FLONASE) 50 MCG/ACT nasal spray Spray two sprays in each nostril once daily (Patient not taking: Reported on 03/25/2021) 16 g 10   No facility-administered medications prior to visit.    Allergies  Allergen Reactions    Latex Itching   Iodinated Diagnostic Agents Hives and Itching    "itching, burning" sensation    ROS Review of Systems  Constitutional:  Positive for fatigue. Negative for activity change, chills and fever.  HENT:  Negative for congestion, postnasal drip, rhinorrhea, sinus pressure, sinus pain, sneezing and sore throat.   Eyes: Negative.   Respiratory:  Negative for cough, shortness of breath and wheezing.   Cardiovascular:  Negative for chest pain and palpitations.  Gastrointestinal:  Negative for constipation, diarrhea, nausea and vomiting.  Endocrine: Negative for cold intolerance, heat intolerance, polydipsia and polyuria.  Genitourinary:  Negative for dysuria, frequency and urgency.  Musculoskeletal:  Negative for back pain and myalgias.  Skin:  Negative for rash.  Allergic/Immunologic: Negative for environmental allergies.  Neurological:  Negative for dizziness, weakness and headaches.  Psychiatric/Behavioral:  The patient is not nervous/anxious.      Objective:    Physical Exam Vitals and nursing note reviewed.  Constitutional:      Appearance: Normal appearance. He is well-developed. He is obese.  HENT:     Head: Normocephalic and atraumatic.     Nose: Nose normal.     Mouth/Throat:     Mouth: Mucous membranes are moist.     Pharynx: Oropharynx is clear.  Eyes:     Extraocular Movements: Extraocular movements intact.     Conjunctiva/sclera: Conjunctivae normal.  Pupils: Pupils are equal, round, and reactive to light.  Cardiovascular:     Rate and Rhythm: Normal rate and regular rhythm.     Pulses: Normal pulses.     Heart sounds: Normal heart sounds.  Pulmonary:     Effort: Pulmonary effort is normal.     Breath sounds: Normal breath sounds.  Abdominal:     Palpations: Abdomen is soft.  Musculoskeletal:        General: Normal range of motion.     Cervical back: Normal range of motion and neck supple.  Lymphadenopathy:     Cervical: No cervical  adenopathy.  Skin:    General: Skin is warm and dry.     Capillary Refill: Capillary refill takes less than 2 seconds.  Neurological:     General: No focal deficit present.     Mental Status: He is alert and oriented to person, place, and time.  Psychiatric:        Mood and Affect: Mood normal.        Behavior: Behavior normal.        Thought Content: Thought content normal.        Judgment: Judgment normal.    Today's Vitals   08/20/21 1118  BP: 106/71  Pulse: 80  Temp: 98.2 F (36.8 C)  SpO2: 96%  Weight: 287 lb 11.2 oz (130.5 kg)  Height: _0  (1.854 m)   Body mass index is 37.96 kg/m.   Wt Readings from Last 3 Encounters:  08/20/21 287 lb 11.2 oz (130.5 kg)  04/07/21 300 lb (136.1 kg)  03/25/21 (!) 303 lb 6.4 oz (137.6 kg)     Health Maintenance Due  Topic Date Due   COVID-19 Vaccine (4 - Booster for Pfizer series) 10/22/2020    There are no preventive care reminders to display for this patient.  Lab Results  Component Value Date   TSH 2.220 04/07/2021   Lab Results  Component Value Date   WBC 6.8 04/07/2021   HGB 16.4 04/07/2021   HCT 49.2 04/07/2021   MCV 85 04/07/2021   PLT 215 04/07/2021   Lab Results  Component Value Date   NA 141 04/07/2021   K 4.6 04/07/2021   CO2 22 04/07/2021   GLUCOSE 91 04/07/2021   BUN 12 04/07/2021   CREATININE 0.97 04/07/2021   BILITOT 0.6 04/07/2021   ALKPHOS 76 04/07/2021   AST 97 (H) 04/07/2021   ALT 158 (H) 04/07/2021   PROT 6.8 04/07/2021   ALBUMIN 4.6 04/07/2021   CALCIUM 9.7 04/07/2021   ANIONGAP 6 09/03/2018   EGFR 99 04/07/2021   Lab Results  Component Value Date   CHOL 202 (H) 04/07/2021   Lab Results  Component Value Date   HDL 33 (L) 04/07/2021   Lab Results  Component Value Date   LDLCALC 151 (H) 04/07/2021   Lab Results  Component Value Date   TRIG 97 04/07/2021   Lab Results  Component Value Date   CHOLHDL 6.1 (H) 04/07/2021   No results found for: HGBA1C    Assessment &  Plan:  1. Weight loss Patient encounter today to discuss management of weight.  Has been on phentermine in the past and done very well.  Has already restarted an old prescription and has lost more than 10 pounds in the last 2 weeks.  We will restart phentermine. Encourage patient to limit calorie intake to 2000 cal/day or less.  He should consume a low cholesterol, low-fat diet.  He  should incorporate exercise into his daily routine.   2. Body mass index (BMI) of 37.0-37.9 in adult Restart phentermine 37.51m tablets daily. Encourage patient to limit calorie intake to 2000 cal/day or less.  He should consume a low cholesterol, low-fat diet.  Patient to incorporate exercise into his daily routine.  - phentermine 37.5 MG capsule; Take 1 capsule (37.5 mg total) by mouth every morning.  Dispense: 30 capsule; Refill: 1   Problem List Items Addressed This Visit       Other   Body mass index (BMI) of 37.0-37.9 in adult   Relevant Medications   phentermine 37.5 MG capsule   Weight loss - Primary    Meds ordered this encounter  Medications   phentermine 37.5 MG capsule    Sig: Take 1 capsule (37.5 mg total) by mouth every morning.    Dispense:  30 capsule    Refill:  1    Order Specific Question:   Supervising Provider    Answer:   MBeatrice LecherD [[id number]]     Follow-up: Return in about 6 weeks (around 10/01/2021) for routine - weight management.    HRonnell Freshwater NP

## 2021-08-31 ENCOUNTER — Other Ambulatory Visit: Payer: Self-pay

## 2021-09-07 DIAGNOSIS — R634 Abnormal weight loss: Secondary | ICD-10-CM | POA: Insufficient documentation

## 2021-10-13 ENCOUNTER — Ambulatory Visit: Payer: 59 | Admitting: Nurse Practitioner

## 2022-03-21 ENCOUNTER — Other Ambulatory Visit: Payer: Self-pay

## 2022-03-21 MED ORDER — TRIAZOLAM 0.25 MG PO TABS
ORAL_TABLET | ORAL | 0 refills | Status: AC
  Filled 2022-03-21: qty 2, 2d supply, fill #0

## 2022-03-21 MED ORDER — DIAZEPAM 10 MG PO TABS
ORAL_TABLET | ORAL | 0 refills | Status: AC
  Filled 2022-03-21: qty 2, 2d supply, fill #0

## 2022-03-23 ENCOUNTER — Other Ambulatory Visit: Payer: Self-pay

## 2022-03-23 MED ORDER — CLINDAMYCIN HCL 150 MG PO CAPS
ORAL_CAPSULE | ORAL | 0 refills | Status: AC
  Filled 2022-03-23: qty 21, 7d supply, fill #0

## 2023-01-23 ENCOUNTER — Other Ambulatory Visit: Payer: Self-pay

## 2023-01-23 ENCOUNTER — Other Ambulatory Visit: Payer: Self-pay | Admitting: Physician Assistant

## 2023-01-23 DIAGNOSIS — J9801 Acute bronchospasm: Secondary | ICD-10-CM

## 2023-01-23 MED ORDER — FLUTICASONE-SALMETEROL 250-50 MCG/ACT IN AEPB
1.0000 | INHALATION_SPRAY | Freq: Two times a day (BID) | RESPIRATORY_TRACT | 1 refills | Status: AC
  Filled 2023-01-23: qty 60, 30d supply, fill #0

## 2023-01-31 ENCOUNTER — Other Ambulatory Visit: Payer: Self-pay

## 2023-01-31 MED ORDER — LORAZEPAM 2 MG PO TABS
4.0000 mg | ORAL_TABLET | Freq: Once | ORAL | 0 refills | Status: AC
  Filled 2023-01-31: qty 2, 1d supply, fill #0

## 2023-02-01 ENCOUNTER — Other Ambulatory Visit (HOSPITAL_COMMUNITY): Payer: Self-pay

## 2023-02-01 ENCOUNTER — Other Ambulatory Visit: Payer: Self-pay

## 2023-02-01 MED ORDER — AMOXICILLIN 500 MG PO CAPS
500.0000 mg | ORAL_CAPSULE | Freq: Three times a day (TID) | ORAL | 0 refills | Status: AC
  Filled 2023-02-01: qty 30, 10d supply, fill #0

## 2023-02-03 ENCOUNTER — Other Ambulatory Visit: Payer: Self-pay

## 2023-02-03 MED ORDER — OXYCODONE-ACETAMINOPHEN 7.5-325 MG PO TABS
1.0000 | ORAL_TABLET | ORAL | 0 refills | Status: AC | PRN
  Filled 2023-02-03: qty 12, 2d supply, fill #0

## 2023-08-31 ENCOUNTER — Other Ambulatory Visit: Payer: Self-pay

## 2023-08-31 MED ORDER — BENZONATATE 100 MG PO CAPS
100.0000 mg | ORAL_CAPSULE | Freq: Three times a day (TID) | ORAL | 0 refills | Status: AC | PRN
  Filled 2023-08-31: qty 30, 10d supply, fill #0

## 2023-08-31 MED ORDER — AZITHROMYCIN 250 MG PO TABS
ORAL_TABLET | ORAL | 0 refills | Status: DC
  Filled 2023-08-31: qty 6, 5d supply, fill #0

## 2023-09-29 ENCOUNTER — Other Ambulatory Visit: Payer: Self-pay

## 2023-09-29 MED ORDER — TRIAZOLAM 0.25 MG PO TABS
ORAL_TABLET | ORAL | 0 refills | Status: AC
  Filled 2023-09-29: qty 2, 1d supply, fill #0

## 2023-09-29 MED ORDER — DIAZEPAM 10 MG PO TABS
ORAL_TABLET | ORAL | 0 refills | Status: AC
  Filled 2023-09-29: qty 2, 2d supply, fill #0

## 2023-10-17 ENCOUNTER — Other Ambulatory Visit: Payer: Self-pay

## 2023-10-29 ENCOUNTER — Other Ambulatory Visit: Payer: Self-pay

## 2023-10-29 MED ORDER — ZEPBOUND 2.5 MG/0.5ML ~~LOC~~ SOAJ
2.5000 mg | SUBCUTANEOUS | 1 refills | Status: AC
  Filled 2023-10-29 – 2023-10-31 (×3): qty 2, 28d supply, fill #0
  Filled 2023-11-27: qty 2, 28d supply, fill #1

## 2023-10-30 ENCOUNTER — Other Ambulatory Visit: Payer: Self-pay

## 2023-10-31 ENCOUNTER — Other Ambulatory Visit: Payer: Self-pay

## 2023-11-10 ENCOUNTER — Other Ambulatory Visit: Payer: Self-pay

## 2023-11-10 ENCOUNTER — Other Ambulatory Visit: Payer: Self-pay | Admitting: Physician Assistant

## 2023-11-10 MED ORDER — AZITHROMYCIN 250 MG PO TABS
ORAL_TABLET | ORAL | 0 refills | Status: AC
Start: 2023-11-10 — End: ?
  Filled 2023-11-10: qty 6, 5d supply, fill #0

## 2023-11-10 MED ORDER — PSEUDOEPH-BROMPHEN-DM 30-2-10 MG/5ML PO SYRP
5.0000 mL | ORAL_SOLUTION | Freq: Four times a day (QID) | ORAL | 0 refills | Status: AC | PRN
Start: 2023-11-10 — End: ?
  Filled 2023-11-10: qty 120, 6d supply, fill #0

## 2023-11-10 MED ORDER — PREDNISONE 10 MG PO TABS
ORAL_TABLET | ORAL | 0 refills | Status: AC
  Filled 2023-11-10: qty 42, 12d supply, fill #0

## 2024-01-07 ENCOUNTER — Other Ambulatory Visit: Payer: Self-pay

## 2024-01-07 MED ORDER — ZEPBOUND 5 MG/0.5ML ~~LOC~~ SOAJ
5.0000 mg | SUBCUTANEOUS | 1 refills | Status: AC
  Filled 2024-01-07 – 2024-01-08 (×2): qty 2, 28d supply, fill #0

## 2024-01-08 ENCOUNTER — Other Ambulatory Visit: Payer: Self-pay

## 2024-01-08 ENCOUNTER — Other Ambulatory Visit (HOSPITAL_COMMUNITY): Payer: Self-pay

## 2024-01-11 ENCOUNTER — Other Ambulatory Visit (HOSPITAL_COMMUNITY): Payer: Self-pay

## 2024-06-17 ENCOUNTER — Other Ambulatory Visit: Payer: Self-pay | Admitting: Nurse Practitioner

## 2024-06-17 ENCOUNTER — Other Ambulatory Visit: Payer: Self-pay

## 2024-06-17 DIAGNOSIS — J40 Bronchitis, not specified as acute or chronic: Secondary | ICD-10-CM

## 2024-06-17 MED ORDER — AZITHROMYCIN 250 MG PO TABS
ORAL_TABLET | ORAL | 0 refills | Status: AC
  Filled 2024-06-17: qty 6, 5d supply, fill #0

## 2024-09-03 ENCOUNTER — Other Ambulatory Visit: Payer: Self-pay

## 2024-09-03 MED ORDER — DIAZEPAM 10 MG PO TABS
10.0000 mg | ORAL_TABLET | ORAL | 0 refills | Status: AC
  Filled 2024-09-03: qty 2, 1d supply, fill #0
